# Patient Record
Sex: Male | Born: 1965 | Race: White | Hispanic: No | Marital: Married | Smoking: Former smoker
Health system: Southern US, Community
[De-identification: ages and names within clinical notes are randomized; demographics above are authoritative.]

## PROBLEM LIST (undated history)

## (undated) DIAGNOSIS — J302 Other seasonal allergic rhinitis: Secondary | ICD-10-CM

## (undated) DIAGNOSIS — J939 Pneumothorax, unspecified: Secondary | ICD-10-CM

## (undated) DIAGNOSIS — R748 Abnormal levels of other serum enzymes: Secondary | ICD-10-CM

## (undated) DIAGNOSIS — R12 Heartburn: Secondary | ICD-10-CM

## (undated) DIAGNOSIS — T8859XA Other complications of anesthesia, initial encounter: Secondary | ICD-10-CM

## (undated) DIAGNOSIS — E785 Hyperlipidemia, unspecified: Secondary | ICD-10-CM

## (undated) HISTORY — DX: Hyperlipidemia, unspecified: E78.5

## (undated) HISTORY — PX: KNEE ARTHROSCOPY: SUR90

## (undated) HISTORY — DX: Heartburn: R12

## (undated) HISTORY — DX: Abnormal levels of other serum enzymes: R74.8

## (undated) HISTORY — DX: Other seasonal allergic rhinitis: J30.2

---

## 1993-01-01 HISTORY — PX: CHEST TUBE INSERTION: SHX231

## 2016-06-13 ENCOUNTER — Encounter: Payer: Self-pay | Admitting: Gastroenterology

## 2016-06-14 ENCOUNTER — Encounter: Payer: Self-pay | Admitting: Gastroenterology

## 2016-06-15 ENCOUNTER — Other Ambulatory Visit: Payer: BLUE CROSS/BLUE SHIELD

## 2016-06-15 ENCOUNTER — Ambulatory Visit (AMBULATORY_SURGERY_CENTER): Payer: Self-pay | Admitting: *Deleted

## 2016-06-15 ENCOUNTER — Other Ambulatory Visit: Payer: Self-pay

## 2016-06-15 ENCOUNTER — Encounter: Payer: Self-pay | Admitting: Gastroenterology

## 2016-06-15 VITALS — Ht 74.0 in | Wt 216.0 lb

## 2016-06-15 DIAGNOSIS — Z1211 Encounter for screening for malignant neoplasm of colon: Secondary | ICD-10-CM

## 2016-06-15 DIAGNOSIS — R197 Diarrhea, unspecified: Secondary | ICD-10-CM

## 2016-06-15 MED ORDER — NA SULFATE-K SULFATE-MG SULF 17.5-3.13-1.6 GM/177ML PO SOLN: 1.0000 | Freq: Once | ORAL | 0 refills | Status: AC

## 2016-06-15 NOTE — Progress Notes (Signed)
Denies allergies to eggs or soy products. Denies complications with sedation or anesthesia. Denies O2 use. Denies use of diet or weight loss medications.  Emmi instructions given for colonoscopy.  

## 2016-06-15 NOTE — Progress Notes (Signed)
Pt states he had c diff approximately 8 months ago, felt it had cleared but never retested. Now with c/o cramping and diarrhea, per Dr. Havery Moros have pt retested prior to colonoscopy.

## 2016-06-21 ENCOUNTER — Telehealth: Payer: Self-pay

## 2016-06-21 NOTE — Telephone Encounter (Signed)
Okay thanks for the follow up 

## 2016-06-21 NOTE — Telephone Encounter (Signed)
I have called and left messages for patient on his cell # and also on his wife's cell #, that we need to have him bring by his stool sample today or tomorrow at the very latest. Explained that we need to have these results to see if it is safe to proceed with his colonoscopy that is scheduled for 06/29/16. Asked that they call me back to let me know what their plan is.

## 2016-06-22 ENCOUNTER — Other Ambulatory Visit: Payer: BLUE CROSS/BLUE SHIELD

## 2016-06-22 DIAGNOSIS — R197 Diarrhea, unspecified: Secondary | ICD-10-CM

## 2016-06-23 LAB — CLOSTRIDIUM DIFFICILE BY PCR: Toxigenic C. Difficile by PCR: NOT DETECTED

## 2016-06-29 ENCOUNTER — Ambulatory Visit (AMBULATORY_SURGERY_CENTER): Payer: BLUE CROSS/BLUE SHIELD | Admitting: Gastroenterology

## 2016-06-29 ENCOUNTER — Encounter: Payer: Self-pay | Admitting: Gastroenterology

## 2016-06-29 VITALS — BP 139/96 | HR 61 | Temp 97.5°F | Resp 10 | Ht 74.0 in | Wt 216.0 lb

## 2016-06-29 DIAGNOSIS — D124 Benign neoplasm of descending colon: Secondary | ICD-10-CM

## 2016-06-29 DIAGNOSIS — Z1211 Encounter for screening for malignant neoplasm of colon: Secondary | ICD-10-CM

## 2016-06-29 DIAGNOSIS — D122 Benign neoplasm of ascending colon: Secondary | ICD-10-CM

## 2016-06-29 DIAGNOSIS — Z1212 Encounter for screening for malignant neoplasm of rectum: Secondary | ICD-10-CM | POA: Diagnosis not present

## 2016-06-29 DIAGNOSIS — D123 Benign neoplasm of transverse colon: Secondary | ICD-10-CM | POA: Diagnosis not present

## 2016-06-29 DIAGNOSIS — D120 Benign neoplasm of cecum: Secondary | ICD-10-CM

## 2016-06-29 HISTORY — PX: COLONOSCOPY: SHX174

## 2016-06-29 MED ORDER — SODIUM CHLORIDE 0.9 % IV SOLN
500.0000 mL | INTRAVENOUS | Status: DC
Start: 2016-06-29 — End: 2019-08-31

## 2016-06-29 NOTE — Patient Instructions (Signed)
Handout given on polyps  YOU HAD AN ENDOSCOPIC PROCEDURE TODAY: Refer to the procedure report and other information in the discharge instructions given to you for any specific questions about what was found during the examination. If this information does not answer your questions, please call [place] office at [phone] to clarify.   YOU SHOULD EXPECT: Some feelings of bloating in the abdomen. Passage of more gas than usual. Walking can help get rid of the air that was put into your GI tract during the procedure and reduce the bloating. If you had a lower endoscopy (such as a colonoscopy or flexible sigmoidoscopy) you may notice spotting of blood in your stool or on the toilet paper. Some abdominal soreness may be present for a day or two, also.  DIET: Your first meal following the procedure should be a light meal and then it is ok to progress to your normal diet. A half-sandwich or bowl of soup is an example of a good first meal. Heavy or fried foods are harder to digest and may make you feel nauseous or bloated. Drink plenty of fluids but you should avoid alcoholic beverages for 24 hours. If you had a esophageal dilation, please see attached instructions for diet.    ACTIVITY: Your care partner should take you home directly after the procedure. You should plan to take it easy, moving slowly for the rest of the day. You can resume normal activity the day after the procedure however YOU SHOULD NOT DRIVE, use power tools, machinery or perform tasks that involve climbing or major physical exertion for 24 hours (because of the sedation medicines used during the test).   SYMPTOMS TO REPORT IMMEDIATELY: A gastroenterologist can be reached at any hour. Please call [phone]  for any of the following symptoms:  Following lower endoscopy (colonoscopy, flexible sigmoidoscopy) Excessive amounts of blood in the stool  Significant tenderness, worsening of abdominal pains  Swelling of the abdomen that is  new, acute  Fever of 100 or higher    FOLLOW UP:  If any biopsies were taken you will be contacted by phone or by letter within the next 1-3 weeks. Call [phone]  if you have not heard about the biopsies in 3 weeks.  Please also call with any specific questions about appointments or follow up tests.  

## 2016-06-29 NOTE — Op Note (Addendum)
[place] Patient Name: Leon Brown Procedure Date: 06/29/2016 7:45 AM MRN: [id number] Endoscopist: Remo Lipps P. Armbruster MD, MD Age: 51 Referring MD:  Date of Birth: 1965/03/13 Gender: Male Account #: [id number] Procedure:                Colonoscopy Indications:              Screening for colorectal malignant neoplasm Medicines:                Monitored Anesthesia Care Procedure:                Pre-Anesthesia Assessment:                           - Prior to the procedure, a History and Physical                            was performed, and patient medications and                            allergies were reviewed. The patient's tolerance of                            previous anesthesia was also reviewed. The risks                            and benefits of the procedure and the sedation                            options and risks were discussed with the patient.                            All questions were answered, and informed consent                            was obtained. Prior Anticoagulants: The patient has                            taken no previous anticoagulant or antiplatelet                            agents. ASA Grade Assessment: II - A patient with                            mild systemic disease. After reviewing the risks                            and benefits, the patient was deemed in                            satisfactory condition to undergo the procedure.                           After obtaining informed consent, the colonoscope  was passed under direct vision. Throughout the                            procedure, the patient's blood pressure, pulse, and                            oxygen saturations were monitored continuously.The                            colonoscopy was performed without difficulty. The                            patient tolerated the procedure well. The quality                            of the bowel  preparation was good. The ileocecal                            valve, appendiceal orifice, and rectum were                            photographed. The Model CF-HQ190L [id number])                            scope was introduced through the anus and advanced                            to the the cecum, identified by appendiceal orifice                            and ileocecal valve. Scope In: 9:20:03 AM Scope Out: 9:45:53 AM Scope Withdrawal Time: 0 hours 23 minutes 51 seconds  Total Procedure Duration: 0 hours 25 minutes 50 seconds  Findings:                 The perianal and digital rectal examinations were                            normal.                           A 4 mm polyp was found in the cecum. The polyp was                            sessile. The polyp was removed with a cold snare.                            Resection and retrieval were complete.                           Two sessile polyps were found in the ascending                            colon. The polyps were 4 to 5 mm in size. These  polyps were removed with a cold snare. Resection                            and retrieval were complete.                           Five sessile polyps were found in the transverse                            colon. The polyps were 3 to 5 mm in size. These                            polyps were removed with a cold snare. Resection                            and retrieval were complete.                           Two sessile polyps were found in the descending                            colon. The polyps were 4 mm in size. These polyps                            were removed with a cold snare. Resection and                            retrieval were complete.                           Internal hemorrhoids were found during                            retroflexion. The hemorrhoids were small.                           The exam was otherwise without  abnormality. Complications:            No immediate complications. Estimated blood loss:                            Minimal. Estimated Blood Loss:     Estimated blood loss was minimal. Impression:               - One 4 mm polyp in the cecum, removed with a cold                            snare. Resected and retrieved.                           - Two 4 to 5 mm polyps in the ascending colon,                            removed with a cold snare. Resected and retrieved.                           -  Five 3 to 5 mm polyps in the transverse colon,                            removed with a cold snare. Resected and retrieved.                           - Two 4 mm polyps in the descending colon, removed                            with a cold snare. Resected and retrieved.                           - Internal hemorrhoids.                           - The examination was otherwise normal. Recommendation:           - Patient has a contact number available for                            emergencies. The signs and symptoms of potential                            delayed complications were discussed with the                            patient. Return to normal activities tomorrow.                            Written discharge instructions were provided to the                            patient.                           - Resume previous diet.                           - Continue present medications.                           - Repeat colonoscopy is recommended for                            surveillance. The colonoscopy date will be                            determined after pathology results from today's                            exam become available for review.                           - Await pathology results.                           -  No ibuprofen, naproxen, or other non-steroidal                            anti-inflammatory drugs for 2 weeks after polyp                             removal. Remo Lipps P. Armbruster MD, MD 06/29/2016 8:20:38 AM This report has been signed electronically.

## 2016-06-29 NOTE — Progress Notes (Signed)
Called to room to assist during endoscopic procedure.  Patient ID and intended procedure confirmed with present staff. Received instructions for my participation in the procedure from the performing physician.  

## 2016-06-29 NOTE — Progress Notes (Signed)
Alert and oriented x3, pleased with MAC, report to RN  

## 2016-07-02 ENCOUNTER — Telehealth: Payer: Self-pay

## 2016-07-02 NOTE — Telephone Encounter (Signed)
  Follow up Call-  Call back number 06/29/2016  Post procedure Call Back phone  # [phone]  Permission to leave phone message Yes  Some recent data might be hidden     Left message

## 2016-07-02 NOTE — Telephone Encounter (Signed)
  Follow up Call-  Call back number 06/29/2016  Post procedure Call Back phone  # [phone]  Permission to leave phone message Yes  Some recent data might be hidden     Patient questions:  Do you have a fever, pain , or abdominal swelling? No. Pain Score  0 *  Have you tolerated food without any problems? Yes.    Have you been able to return to your normal activities? Yes.    Do you have any questions about your discharge instructions: Diet   No. Medications  No. Follow up visit  No.  Do you have questions or concerns about your Care? No.  Actions: * If pain score is 4 or above: No action needed, pain <4.

## 2016-07-06 ENCOUNTER — Encounter: Payer: Self-pay | Admitting: Gastroenterology

## 2018-09-11 ENCOUNTER — Emergency Department (HOSPITAL_COMMUNITY): Payer: BC Managed Care – PPO

## 2018-09-11 ENCOUNTER — Other Ambulatory Visit: Payer: Self-pay

## 2018-09-11 ENCOUNTER — Emergency Department (HOSPITAL_COMMUNITY)
Admission: EM | Admit: 2018-09-11 | Discharge: 2018-09-11 | Disposition: A | Discharge status: Home / Self Care | Payer: BC Managed Care – PPO | Attending: Emergency Medicine | Admitting: Emergency Medicine

## 2018-09-11 ENCOUNTER — Encounter (HOSPITAL_COMMUNITY): Payer: Self-pay | Admitting: Emergency Medicine

## 2018-09-11 DIAGNOSIS — R0789 Other chest pain: Secondary | ICD-10-CM | POA: Insufficient documentation

## 2018-09-11 DIAGNOSIS — Z87891 Personal history of nicotine dependence: Secondary | ICD-10-CM | POA: Diagnosis not present

## 2018-09-11 DIAGNOSIS — I10 Essential (primary) hypertension: Secondary | ICD-10-CM | POA: Insufficient documentation

## 2018-09-11 DIAGNOSIS — Z7982 Long term (current) use of aspirin: Secondary | ICD-10-CM | POA: Insufficient documentation

## 2018-09-11 DIAGNOSIS — R079 Chest pain, unspecified: Secondary | ICD-10-CM | POA: Diagnosis present

## 2018-09-11 LAB — BASIC METABOLIC PANEL
Anion gap: 11 (ref 5–15)
BUN: 12 mg/dL (ref 6–20)
CO2: 24 mmol/L (ref 22–32)
Calcium: 9.2 mg/dL (ref 8.9–10.3)
Chloride: 103 mmol/L (ref 98–111)
Creatinine, Ser: 0.97 mg/dL (ref 0.61–1.24)
GFR calc Af Amer: >60 mL/min (ref >60)
GFR calc non Af Amer: >60 mL/min (ref >60)
Glucose, Bld: 90 mg/dL (ref 70–99)
Potassium: 3.5 mmol/L (ref 3.5–5.1)
Sodium: 138 mmol/L (ref 135–145)

## 2018-09-11 LAB — TROPONIN I (HIGH SENSITIVITY)
Troponin I (High Sensitivity): 5 ng/L (ref <18)
Troponin I (High Sensitivity): 5 ng/L (ref <18)

## 2018-09-11 LAB — CBC
HCT: 46 % (ref 39.0–52.0)
Hemoglobin: 15.6 g/dL (ref 13.0–17.0)
MCH: 32.7 pg (ref 26.0–34.0)
MCHC: 33.9 g/dL (ref 30.0–36.0)
MCV: 96.4 fL (ref 80.0–100.0)
Platelets: 210 10*3/uL (ref 150–400)
RBC: 4.77 MIL/uL (ref 4.22–5.81)
RDW: 13.1 % (ref 11.5–15.5)
WBC: 6.7 10*3/uL (ref 4.0–10.5)
nRBC: 0 % (ref 0.0–0.2)

## 2018-09-11 MED ORDER — SODIUM CHLORIDE 0.9% FLUSH: 3.0000 mL | Freq: Once | INTRAVENOUS | Status: DC

## 2018-09-11 MED ORDER — AMLODIPINE BESYLATE 5 MG PO TABS: 5.0000 mg | ORAL_TABLET | Freq: Every day | ORAL | 0 refills | Status: DC

## 2018-09-11 NOTE — ED Notes (Signed)
Pt has 0 pain now. States pain resolved at 1030. Wants to be evaluated to make sure no cardiac event

## 2018-09-11 NOTE — ED Triage Notes (Signed)
Patient states he thinks he might have had a heart attack this morning at 1000. Patient states he had L sided chest pain, diaphoresis, and lightheadedness. Denies pain at present. No nausea or vomiting. Patient denies cardiac history.

## 2018-09-11 NOTE — Discharge Instructions (Signed)
Your lab tests, chest xray and ekg are reassuring tonight that you did not have a heart attack today.  However, your blood pressure has been elevated and you would benefit from starting a blood pressure medicine which has been prescribed.  Also, you would benefit by seeing a cardiologist to set up a cardiac stress test to help ensure cardiac health.

## 2018-09-11 NOTE — ED Provider Notes (Signed)
[facility] EMERGENCY DEPARTMENT Provider Note   CSN: [id number] Arrival date & time: 09/11/18  1557     History   Chief Complaint Chief Complaint  Patient presents with  . Chest Pain    HPI Leon Brown is a 53 y.o. male.  HPI: A 53 year old patient presents for evaluation of chest pain. Initial onset of pain was more than 6 hours ago. The patient's chest pain is sharp and is not worse with exertion. The patient reports some diaphoresis. The patient's chest pain is middle- or left-sided, is not well-localized, is not described as heaviness/pressure/tightness and does not radiate to the arms/jaw/neck. The patient does not complain of nausea. The patient has no history of stroke, has no history of peripheral artery disease, has not smoked in the past 90 days, denies any history of treated diabetes, has no relevant family history of coronary artery disease (first degree relative at less than age 43), is not hypertensive, has no history of hypercholesterolemia and does not have an elevated BMI (>=30).   The history is provided by the patient.  Chest Pain Pain location:  L chest Pain quality: sharp and stabbing   Radiates to: radiated to right chest. Pain severity:  Severe Onset quality:  Sudden Duration:  30 minutes Timing:  Constant Progression:  Resolved Chronicity:  New Context comment:  Patient was working a Tourist information centre manager. episode occured at 10 am today Relieved by:  Rest (He took an adult aspirin on his way home from work this afternoon) Worsened by:  Nothing Ineffective treatments:  None tried Associated symptoms: diaphoresis and dizziness   Associated symptoms: no abdominal pain, no back pain, no fever, no headache, no lower extremity edema, no nausea, no near-syncope, no numbness, no palpitations, no shortness of breath, no syncope, no vomiting and no weakness   Risk factors: male sex   Risk factors comment:  Distant h/o smoking, quit 20+ years ago  Pt has h/o left  spontaneous pneumothorax when in his 20's requiring a chest tube insertion.  Past Medical History:  Diagnosis Date  . Seasonal allergies     There are no active problems to display for this patient.   Past Surgical History:  Procedure Laterality Date  . CHEST TUBE INSERTION Left 1995        Home Medications    Prior to Admission medications   Medication Sig Start Date End Date Taking? Authorizing Provider  aspirin EC 325 MG tablet Take 325 mg by mouth once as needed for mild pain.   Yes [provider]  amLODipine (NORVASC) 5 MG tablet Take 1 tablet (5 mg total) by mouth daily. 09/11/18   Evalee Jefferson, PA-C    Family History Family History  Problem Relation Age of Onset  . Colon cancer Neg Hx   . Esophageal cancer Neg Hx   . Rectal cancer Neg Hx   . Stomach cancer Neg Hx     Social History Social History   Tobacco Use  . Smoking status: Former Smoker    Quit date: 01/01/1993    Years since quitting: 25.7  . Smokeless tobacco: Never Used  Substance Use Topics  . Alcohol use: Yes    Alcohol/week: 10.0 standard drinks    Types: 10 Standard drinks or equivalent per week    Comment: 2 drinks liquor a night  . Drug use: No     Allergies   Patient has no known allergies.   Review of Systems Review of Systems  Constitutional: Positive for  diaphoresis. Negative for fever.  HENT: Negative for congestion and sore throat.   Eyes: Negative.   Respiratory: Negative for chest tightness and shortness of breath.   Cardiovascular: Positive for chest pain. Negative for palpitations, syncope and near-syncope.  Gastrointestinal: Negative for abdominal pain, nausea and vomiting.  Genitourinary: Negative.   Musculoskeletal: Negative for arthralgias, back pain, joint swelling and neck pain.  Skin: Negative.  Negative for rash and wound.  Neurological: Positive for dizziness. Negative for weakness, light-headedness, numbness and headaches.  Psychiatric/Behavioral:  Negative.      Physical Exam Updated Vital Signs BP (!) 151/102   Pulse 74   Temp 98.2 F (36.8 C) (Oral)   Resp 19   Ht 6\' 1"  (1.854 m)   Wt 95.3 kg   SpO2 98%   BMI 27.71 kg/m   Physical Exam Vitals signs and nursing note reviewed.  Constitutional:      Appearance: He is well-developed.  HENT:     Head: Normocephalic and atraumatic.  Eyes:     Conjunctiva/sclera: Conjunctivae normal.  Neck:     Musculoskeletal: Normal range of motion.  Cardiovascular:     Rate and Rhythm: Normal rate and regular rhythm.     Heart sounds: Normal heart sounds.     Comments: Hypertensive  Pulmonary:     Effort: Pulmonary effort is normal.     Breath sounds: Normal breath sounds. No wheezing.  Abdominal:     General: Bowel sounds are normal.     Palpations: Abdomen is soft.     Tenderness: There is no abdominal tenderness.  Musculoskeletal: Normal range of motion.  Skin:    General: Skin is warm and dry.  Neurological:     Mental Status: He is alert.      ED Treatments / Results  Labs (all labs ordered are listed, but only abnormal results are displayed) Labs Reviewed  BASIC METABOLIC PANEL  CBC  TROPONIN I (HIGH SENSITIVITY)  TROPONIN I (HIGH SENSITIVITY)    EKG EKG Interpretation  Date/Time:  Thursday September 11 2018 16:06:39 EDT Ventricular Rate:  83 PR Interval:  152 QRS Duration: 94 QT Interval:  330 QTC Calculation: 387 R Axis:   65 Text Interpretation:  Normal sinus rhythm Normal ECG Confirmed by Milton Ferguson [phone]) on 09/11/2018 4:55:21 PM   Radiology Dg Chest 2 View  Result Date: 09/11/2018 CLINICAL DATA:  Chest pain. EXAM: CHEST - 2 VIEW COMPARISON:  None. FINDINGS: Cardiomediastinal silhouette is normal. Mediastinal contours appear intact. Ectasia of the aorta. There is no evidence of focal airspace consolidation, pleural effusion or pneumothorax. Osseous structures are without acute abnormality. Soft tissues are grossly normal. IMPRESSION: No  active cardiopulmonary disease. Electronically Signed   By: Fidela Salisbury M.D.   On: 09/11/2018 17:10    Procedures Procedures (including critical care time)  Medications Ordered in ED Medications  sodium chloride flush (NS) 0.9 % injection 3 mL (3 mLs Intravenous Not Given 09/11/18 1621)     Initial Impression / Assessment and Plan / ED Course  I have reviewed the triage vital signs and the nursing notes.  Pertinent labs & imaging results that were available during my care of the patient were reviewed by me and considered in my medical decision making (see chart for details).     HEAR Score: 1  Pt with sudden onset of sharp chest pain lasting 30 minutes 11 am today, then resolved. Atypical, delta troponins stable at 5 with normal ekg.  Heart score 1. He has  remained hypertensive while here, not on medications for this. Started norvasc. Plan f/u with cardiology for further cardiac stress testing.  Return precautions discussed.   Final Clinical Impressions(s) / ED Diagnoses   Final diagnoses:  Atypical chest pain  Essential hypertension    ED Discharge Orders         Ordered    amLODipine (NORVASC) 5 MG tablet  Daily     09/11/18 2013           Evalee Jefferson, Hershal Coria 09/11/18 2016    Milton Ferguson, MD 09/12/18 1103

## 2018-10-04 NOTE — Progress Notes (Signed)
CARDIOLOGY CONSULT NOTE       Patient ID: Leon Brown MRN: [id number] DOB/AGE: [birth year] 53 y.o.  Admit date: (Not on file) Referring Physician: Evalee Jefferson PA-C Primary Physician: [facility] Primary Cardiologist: New Reason for Consultation: Chest Pain   Active Problems:   * No active hospital problems. *   HPI:  53 y.o. history of smoking and HTN referred by Evalee Jefferson [facility] for atypical chest pain. Reviewed note from  09/11/18 Sharp non exertional SSCP left sided. Over 6 hours. 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 Having 2nd grand child in 2 weeks  ROS All other systems reviewed and negative except as noted above  Past Medical History:  Diagnosis Date  . Seasonal allergies     Family History  Problem Relation Age of Onset  . Colon cancer Neg Hx   . Esophageal cancer Neg Hx   . Rectal cancer Neg Hx   . Stomach cancer Neg Hx     Social History   Socioeconomic History  . Marital status: Married    Spouse name: Not on file  . Number of children: Not on file  . Years of education: Not on file  . Highest education level: Not on file  Occupational History  . Not on file  Social Needs  . Financial resource strain: Not on file  . Food insecurity    Worry: Not on file    Inability: Not on file  . Transportation needs    Medical: Not on file    Non-medical: Not on file  Tobacco Use  . Smoking status: Former Smoker    Quit date: 01/01/1993    Years since quitting: 25.7  . Smokeless tobacco: Never Used  Substance and Sexual Activity  . Alcohol use: Yes    Alcohol/week: 10.0 standard drinks    Types: 10 Standard drinks or equivalent per week    Comment: 2 drinks liquor a night  . Drug use: No   . Sexual activity: Not on file  Lifestyle  . Physical activity    Days per week: Not on file    Minutes per session: Not on file  . Stress: Not on file  Relationships  . Social Herbalist on phone: Not on file    Gets together: Not on file    Attends religious service: Not on file    Active member of club or organization: Not on file    Attends meetings of clubs or organizations: Not on file    Relationship status: Not on file  . Intimate partner violence    Fear of current or ex partner: Not on file    Emotionally abused: Not on file    Physically abused: Not on file    Forced sexual activity: Not on file  Other Topics Concern  . Not on file  Social History Narrative  . Not on file    Past Surgical History:  Procedure Laterality Date  . CHEST TUBE INSERTION Left 1995      . sodium chloride      Physical Exam: Blood pressure (!) 153/97, pulse (!) 109, temperature (!) 97.1 F (36.2 C), temperature source Temporal, height  6\' 1"  (1.854 m), weight 212 lb (96.2 kg), SpO2 97 %.   Affect appropriate Healthy:  appears stated age 53: normal Neck supple with no adenopathy JVP normal no bruits no thyromegaly Lungs clear with no wheezing and good diaphragmatic motion Heart:  S1/S2 no murmur, no rub, gallop or click PMI normal Abdomen: benighn, BS positve, no tenderness, no AAA no bruit.  No HSM or HJR Distal pulses intact with no bruits No edema Neuro non-focal Skin warm and dry No muscular weakness   Labs:   Lab Results  Component Value Date   WBC 6.7 09/11/2018   HGB 15.6 09/11/2018   HCT 46.0 09/11/2018   MCV 96.4 09/11/2018   PLT 210 09/11/2018      Radiology: Dg Chest 2 View  Result Date: 09/11/2018 CLINICAL DATA:  Chest pain. EXAM: CHEST - 2 VIEW COMPARISON:  None. FINDINGS: Cardiomediastinal silhouette is normal. Mediastinal contours appear intact. Ectasia of the aorta. There is no evidence of focal airspace consolidation, pleural effusion  or pneumothorax. Osseous structures are without acute abnormality. Soft tissues are grossly normal. IMPRESSION: No active cardiopulmonary disease. Electronically Signed   By: Fidela Salisbury M.D.   On: 09/11/2018 17:10    EKG: 0/10/20 NSR rate 83 normal    ASSESSMENT AND PLAN:   1. Chest Pain:  Atypical r/o in ER normal ECG f/u ETT 2. Previous smoker:  CXR in ER NAD 3. ETOH:  counseled on moderation  4. HTN:  Continue amlodipine assess BP response to exercise see #1  Signed: Jenkins Rouge 10/07/2018, 10:28 AM

## 2018-10-07 ENCOUNTER — Ambulatory Visit: Payer: BC Managed Care – PPO | Admitting: Cardiovascular Disease

## 2018-10-07 ENCOUNTER — Other Ambulatory Visit: Payer: Self-pay

## 2018-10-07 ENCOUNTER — Encounter: Payer: Self-pay | Admitting: Cardiovascular Disease

## 2018-10-07 VITALS — BP 153/97 | HR 109 | Temp 97.1°F | Ht 73.0 in | Wt 212.0 lb

## 2018-10-07 DIAGNOSIS — R079 Chest pain, unspecified: Secondary | ICD-10-CM

## 2018-10-07 NOTE — Patient Instructions (Signed)
Medication Instructions:  Your physician recommends that you continue on your current medications as directed. Please refer to the Current Medication list given to you today.   Labwork: NONE  Testing/Procedures: Your physician has requested that you have an exercise tolerance test. For further information please visit [email or web address]. Please also follow instruction sheet, as given.    Follow-Up: Your physician recommends that you schedule a follow-up appointment in: AS NEEDED    Any Other Special Instructions Will Be Listed Below (If Applicable).     If you need a refill on your cardiac medications before your next appointment, please call your pharmacy.

## 2018-10-10 ENCOUNTER — Other Ambulatory Visit (HOSPITAL_COMMUNITY)
Admission: RE | Admit: 2018-10-10 | Discharge: 2018-10-10 | Disposition: A | Discharge status: Home / Self Care | Payer: BC Managed Care – PPO | Source: Ambulatory Visit | Attending: Cardiovascular Disease | Admitting: Cardiovascular Disease

## 2018-10-10 ENCOUNTER — Other Ambulatory Visit: Payer: Self-pay

## 2018-10-14 ENCOUNTER — Ambulatory Visit (HOSPITAL_COMMUNITY)
Admission: RE | Admit: 2018-10-14 | Discharge: 2018-10-14 | Disposition: A | Discharge status: Home / Self Care | Payer: BC Managed Care – PPO | Source: Ambulatory Visit | Attending: Cardiovascular Disease | Admitting: Cardiovascular Disease

## 2018-10-14 ENCOUNTER — Other Ambulatory Visit: Payer: Self-pay

## 2018-10-14 DIAGNOSIS — R079 Chest pain, unspecified: Secondary | ICD-10-CM | POA: Insufficient documentation

## 2018-10-14 LAB — EXERCISE TOLERANCE TEST
Estimated workload: 10.1 METS
Exercise duration (min): 8 min
Exercise duration (sec): 20 s
MPHR: 167 {beats}/min
Peak HR: 151 {beats}/min
Percent HR: 90 %
RPE: 13
Rest HR: 88 {beats}/min

## 2018-10-16 ENCOUNTER — Telehealth: Payer: Self-pay

## 2018-10-16 NOTE — Telephone Encounter (Signed)
-----   Message from Josue Hector, MD sent at 10/14/2018 12:08 PM EDT ----- Normal ETT

## 2018-10-16 NOTE — Telephone Encounter (Signed)
Pt.notified

## 2018-11-02 DIAGNOSIS — U071 COVID-19: Secondary | ICD-10-CM

## 2018-11-02 HISTORY — DX: COVID-19: U07.1

## 2018-12-19 ENCOUNTER — Other Ambulatory Visit: Payer: Self-pay

## 2018-12-19 ENCOUNTER — Encounter (HOSPITAL_COMMUNITY): Payer: Self-pay | Admitting: Emergency Medicine

## 2018-12-19 ENCOUNTER — Emergency Department (HOSPITAL_COMMUNITY)
Admission: EM | Admit: 2018-12-19 | Discharge: 2018-12-19 | Disposition: A | Discharge status: Home / Self Care | Payer: BC Managed Care – PPO | Attending: Emergency Medicine | Admitting: Emergency Medicine

## 2018-12-19 ENCOUNTER — Emergency Department (HOSPITAL_COMMUNITY): Payer: BC Managed Care – PPO

## 2018-12-19 DIAGNOSIS — Z20828 Contact with and (suspected) exposure to other viral communicable diseases: Secondary | ICD-10-CM | POA: Diagnosis not present

## 2018-12-19 DIAGNOSIS — Z87891 Personal history of nicotine dependence: Secondary | ICD-10-CM | POA: Diagnosis not present

## 2018-12-19 DIAGNOSIS — R0602 Shortness of breath: Secondary | ICD-10-CM | POA: Diagnosis present

## 2018-12-19 DIAGNOSIS — Z7982 Long term (current) use of aspirin: Secondary | ICD-10-CM | POA: Diagnosis not present

## 2018-12-19 DIAGNOSIS — Z20822 Contact with and (suspected) exposure to covid-19: Secondary | ICD-10-CM

## 2018-12-19 DIAGNOSIS — Z79899 Other long term (current) drug therapy: Secondary | ICD-10-CM | POA: Insufficient documentation

## 2018-12-19 HISTORY — DX: Pneumothorax, unspecified: J93.9

## 2018-12-19 MED ORDER — BENZONATATE 100 MG PO CAPS: 100.0000 mg | ORAL_CAPSULE | Freq: Three times a day (TID) | ORAL | 0 refills | Status: DC | PRN

## 2018-12-19 MED ORDER — ALBUTEROL SULFATE HFA 108 (90 BASE) MCG/ACT IN AERS
1.0000 | INHALATION_SPRAY | Freq: Once | RESPIRATORY_TRACT | Status: AC
  Administered 2018-12-19: 1 via RESPIRATORY_TRACT
  Filled 2018-12-19: qty 6.7

## 2018-12-19 NOTE — ED Notes (Signed)
Saturday after Thanksgiving, pt wife, child and other family members tested positive for covid;   In the ensuing 3 weeks since pt has experienced dyspnea   Has not been tested  Here stating SOB worse in the last several days   Has not sought care with PCP but states urgent care sent him here

## 2018-12-19 NOTE — Discharge Instructions (Signed)
Start using albuterol inhaler 1 to 2 puffs every 4-6 hours as needed for shortness of breath.  If you find that you are using the inhaler more than prescribed please return to the emergency department for evaluation.  You can take 1 to 2 tablets of Tylenol every 6 hours as needed for pain or fever.  Drink plenty of fluids and get plenty of rest.  Take Tessalon as needed for cough.  You can also use over-the-counter medications such as NyQuil, DayQuil, and allergy medicines which can help clear up cough and nasal congestion.  Follow-up with your primary care provider for reevaluation of your symptoms.  You may also find helpful to buy a pulse oximeter which is a device that checks your oxygen levels.  Come to the emergency department immediately if your oxygen levels drop below 90% at any point or if you find that you are very short of breath.  Return to the emergency department sooner if any concerning signs or symptoms develop such as persistent vomiting, uncontrolled fevers, loss of consciousness, severe chest pain or shortness of breath.

## 2018-12-19 NOTE — ED Provider Notes (Signed)
[facility] EMERGENCY DEPARTMENT Provider Note   CSN: [id number] Arrival date & time: 12/19/18  1446     History Chief Complaint  Patient presents with  . Shortness of Breath    Leon Brown is a 53 y.o. male with history of pneumothorax and seasonal allergies presents for evaluation of gradual onset, progressively worsening shortness of breath for 2 or 3 weeks.  He reports that he thinks he had a Covid over Thanksgiving as he and his family traveled to [place] and were around many people.  He reports his wife, child, and other family members all tested positive for Covid but he was not tested.  He had loss of taste and smell, cough, fatigue.  The majority of the symptoms lasted for about 7 days before resolving but he states "towards the tail end I began to develop shortness of breath".  He reports that this has been more persistent and is now fairly constant, worsens with any exertion.  He denies chest pain, fevers, nausea, vomiting, abdominal pain.  He has had some decreased appetite but is able to tolerate p.o. food and fluids without difficulty.  He has not tried anything for his symptoms.  He is a former smoker but quit when he had a pneumothorax in his 41s.  Reports his symptoms do not feel like when he had a pneumothorax.  He denies leg swelling, hemoptysis, prior history of DVT or PE, or hormone replacement therapy.  When he traveled to [facility] they drove the distance of around 4 hours and he does report taking breaks during that time.  The history is provided by the patient.       Past Medical History:  Diagnosis Date  . Pneumothorax   . Seasonal allergies     There are no problems to display for this patient.   Past Surgical History:  Procedure Laterality Date  . CHEST TUBE INSERTION Left 1995       Family History  Problem Relation Age of Onset  . Colon cancer Neg Hx   . Esophageal cancer Neg Hx   . Rectal cancer Neg Hx   . Stomach cancer  Neg Hx     Social History   Tobacco Use  . Smoking status: Former Smoker    Quit date: 01/01/1993    Years since quitting: 25.9  . Smokeless tobacco: Never Used  Substance Use Topics  . Alcohol use: Yes    Alcohol/week: 10.0 standard drinks    Types: 10 Standard drinks or equivalent per week    Comment: 2 drinks liquor a night  . Drug use: No    Home Medications Prior to Admission medications   Medication Sig Start Date End Date Taking? Authorizing Provider  amLODipine (NORVASC) 5 MG tablet Take 1 tablet (5 mg total) by mouth daily. Patient not taking: Reported on 10/07/2018 09/11/18   Evalee Jefferson, PA-C  aspirin EC 325 MG tablet Take 325 mg by mouth once as needed for mild pain.    [provider]  benzonatate (TESSALON) 100 MG capsule Take 1 capsule (100 mg total) by mouth 3 (three) times daily as needed for cough. 12/19/18   Rodell Perna A, PA-C    Allergies    Patient has no known allergies.  Review of Systems   Review of Systems  Constitutional: Negative for chills and fever.  Respiratory: Positive for cough and shortness of breath.   Cardiovascular: Negative for chest pain and leg swelling.  Gastrointestinal: Negative for  abdominal pain, nausea and vomiting.  All other systems reviewed and are negative.   Physical Exam Updated Vital Signs BP 125/89 (BP Location: Left Arm)   Pulse 79   Temp 98.7 F (37.1 C) (Oral)   Resp 17   Ht 6\' 1"  (1.854 m)   Wt 90.7 kg   SpO2 99%   BMI 26.39 kg/m   Physical Exam Vitals and nursing note reviewed.  Constitutional:      General: He is not in acute distress.    Appearance: He is well-developed.     Comments: Resting comfortably in bed  HENT:     Head: Normocephalic and atraumatic.  Eyes:     General:        Right eye: No discharge.        Left eye: No discharge.     Conjunctiva/sclera: Conjunctivae normal.  Neck:     Vascular: No JVD.     Trachea: No tracheal deviation.  Cardiovascular:     Rate and  Rhythm: Normal rate and regular rhythm.     Comments: 2+ radial and DP/PT pulses bilaterally, Homans sign absent bilaterally, no lower extremity edema, no palpable cords, compartments are soft  Pulmonary:     Effort: Pulmonary effort is normal.     Breath sounds: Normal breath sounds.     Comments: Equal rise and fall of chest, no increased work of breathing.  Speaking in full sentences without difficulty, SPO2 saturations 99 to 100% on room air and remained in this range with ambulation on pulse oximetry. Abdominal:     General: Bowel sounds are normal. There is no distension.     Palpations: Abdomen is soft.     Tenderness: There is no abdominal tenderness.  Musculoskeletal:     Cervical back: Neck supple.     Right lower leg: No tenderness. No edema.     Left lower leg: No tenderness. No edema.  Skin:    General: Skin is warm and dry.     Findings: No erythema.  Neurological:     Mental Status: He is alert.  Psychiatric:        Behavior: Behavior normal.     ED Results / Procedures / Treatments   Labs (all labs ordered are listed, but only abnormal results are displayed) Labs Reviewed  NOVEL CORONAVIRUS, NAA (HOSP ORDER, SEND-OUT TO REF LAB; TAT 18-24 HRS)    EKG EKG Interpretation  Date/Time:  Friday December 19 2018 15:15:36 EST Ventricular Rate:  85 PR Interval:  156 QRS Duration: 92 QT Interval:  340 QTC Calculation: 404 R Axis:   79 Text Interpretation: Normal sinus rhythm Normal ECG Confirmed by Lennice Sites ([id number]) on 12/20/2018 9:09:50 AM   Radiology DG Chest Portable 1 View  Result Date: 12/19/2018 CLINICAL DATA:  Shortness of breath.  History of COVID is suspected. EXAM: PORTABLE CHEST 1 VIEW COMPARISON:  09/11/2018 FINDINGS: Cardiomediastinal contours are normal.  Lungs are clear. No signs of pleural effusion. No acute bone finding. IMPRESSION: No acute cardiopulmonary disease. Electronically Signed   By: Zetta Bills M.D.   On: 12/19/2018 20:53     Procedures Procedures (including critical care time)  Medications Ordered in ED Medications  albuterol (VENTOLIN HFA) 108 (90 Base) MCG/ACT inhaler 1 puff (1 puff Inhalation Given 12/19/18 2116)    ED Course  I have reviewed the triage vital signs and the nursing notes.  Pertinent labs & imaging results that were available during my care of the patient were reviewed  by me and considered in my medical decision making (see chart for details).    MDM Rules/Calculators/A&P                      Leon Brown was evaluated in Emergency Department on 12/20/2018 for the symptoms described in the history of present illness. He was evaluated in the context of the global COVID-19 pandemic, which necessitated consideration that the patient might be at risk for infection with the SARS-CoV-2 virus that causes COVID-19. Institutional protocols and algorithms that pertain to the evaluation of patients at risk for COVID-19 are in a state of rapid change based on information released by regulatory bodies including the CDC and federal and state organizations. These policies and algorithms were followed during the patient's care in the ED.  Patient presenting for evaluation of progressively worsening shortness of breath.  Recently had COVID-19 symptoms and noted multiple family members tested positive but he himself did not get tested at all.  He likely had COVID-19 and is experiencing some residual shortness of breath as a result of the infection.  On my assessment he is afebrile and vital signs are stable.  He is nontoxic in appearance.  No evidence of respiratory distress and he is speaking in full sentences without difficulty.  I ambulated him in the room on pulse oximetry with stable SPO2 saturations between 99 to 100% on room air and he did not exhibit significantly increased work of breathing with ambulation.  Chest x-ray was obtained which shows no acute cardiopulmonary abnormalities, no evidence of  edema, consolidation, or pneumothorax.  Considered PE however in the absence of tachycardia, hypoxia, or tachypnea I have a low suspicion of this.  He did have recent travel however no prolonged immobilization with his travel and he is overall low risk for PE.  EKG shows normal sinus rhythm, no acute ischemic abnormalities.  Doubt ACS/MI, cardiac tamponade, esophageal rupture, or pneumonia.  Discussed quarantining at home per current CDC guidelines, symptomatic management, and purchasing a pulse oximeter to monitor oxygen levels at home.  Recommend follow-up with PCP for reevaluation of symptoms.  Discussed strict ED return precautions. Patient verbalized understanding of and agreement with plan and is safe for discharge home at this time.  Final Clinical Impression(s) / ED Diagnoses Final diagnoses:  Shortness of breath  Suspected COVID-19 virus infection    Rx / DC Orders ED Discharge Orders         Ordered    benzonatate (TESSALON) 100 MG capsule  3 times daily PRN     12/19/18 2101           Renita Papa, PA-C 12/20/18 1802    Maudie Flakes, MD 12/23/18 1555

## 2018-12-19 NOTE — ED Notes (Signed)
No EKG needed per [facility]

## 2018-12-19 NOTE — ED Triage Notes (Signed)
Patient reports increasing SOB for the past 3 weeks. Patient states he had Covid, was not tested but all family members he lives with were posiitve. Patient reports over the past 3 days his breathing has become worse. 98% on RA in Triage, RR 18.

## 2018-12-21 LAB — NOVEL CORONAVIRUS, NAA (HOSP ORDER, SEND-OUT TO REF LAB; TAT 18-24 HRS): SARS-CoV-2, NAA: NOT DETECTED

## 2019-07-10 ENCOUNTER — Encounter: Payer: Self-pay | Admitting: Gastroenterology

## 2019-08-31 ENCOUNTER — Ambulatory Visit (AMBULATORY_SURGERY_CENTER): Payer: Self-pay | Admitting: *Deleted

## 2019-08-31 ENCOUNTER — Encounter: Payer: Self-pay | Admitting: Gastroenterology

## 2019-08-31 ENCOUNTER — Other Ambulatory Visit: Payer: Self-pay

## 2019-08-31 VITALS — Ht 73.0 in | Wt 192.0 lb

## 2019-08-31 DIAGNOSIS — Z01818 Encounter for other preprocedural examination: Secondary | ICD-10-CM

## 2019-08-31 DIAGNOSIS — Z86010 Personal history of colonic polyps: Secondary | ICD-10-CM

## 2019-08-31 MED ORDER — NA SULFATE-K SULFATE-MG SULF 17.5-3.13-1.6 GM/177ML PO SOLN: ORAL | 0 refills | Status: DC

## 2019-08-31 NOTE — Progress Notes (Signed)
Patient is here in-person for PV. Patient denies any allergies to eggs or soy. Patient denies any problems with anesthesia/sedation. Patient denies any oxygen use at home. Patient denies taking any diet/weight loss medications or blood thinners. Patient is not being treated for MRSA or C-diff. Patient is aware of our care-partner policy and ZPSUG-64 safety protocol.  COVID-19 screening test is on 9/8, the pt is aware.   Prep Prescription coupon given to the patient. Patient is having heartburn, nausea  And weight loss, made office visit 1sty available to see APP. Patient wants to do the colonoscopy as scheduled.

## 2019-09-09 ENCOUNTER — Ambulatory Visit (INDEPENDENT_AMBULATORY_CARE_PROVIDER_SITE_OTHER): Payer: BC Managed Care – PPO

## 2019-09-09 ENCOUNTER — Other Ambulatory Visit: Payer: Self-pay | Admitting: Gastroenterology

## 2019-09-09 DIAGNOSIS — Z1159 Encounter for screening for other viral diseases: Secondary | ICD-10-CM

## 2019-09-09 LAB — SARS CORONAVIRUS 2 (TAT 6-24 HRS): SARS Coronavirus 2: NEGATIVE

## 2019-09-14 ENCOUNTER — Ambulatory Visit (AMBULATORY_SURGERY_CENTER): Payer: BC Managed Care – PPO | Admitting: Gastroenterology

## 2019-09-14 ENCOUNTER — Other Ambulatory Visit: Payer: Self-pay

## 2019-09-14 ENCOUNTER — Encounter: Payer: Self-pay | Admitting: Gastroenterology

## 2019-09-14 VITALS — BP 124/69 | HR 67 | Temp 96.8°F | Resp 15 | Ht 73.0 in | Wt 192.0 lb

## 2019-09-14 DIAGNOSIS — D124 Benign neoplasm of descending colon: Secondary | ICD-10-CM

## 2019-09-14 DIAGNOSIS — D123 Benign neoplasm of transverse colon: Secondary | ICD-10-CM

## 2019-09-14 DIAGNOSIS — Z86010 Personal history of colonic polyps: Secondary | ICD-10-CM | POA: Diagnosis not present

## 2019-09-14 MED ORDER — SODIUM CHLORIDE 0.9 % IV SOLN: 500.0000 mL | Freq: Once | INTRAVENOUS | Status: DC

## 2019-09-14 NOTE — Progress Notes (Signed)
Pt's states no medical or surgical changes since previsit or office visit. 

## 2019-09-14 NOTE — Progress Notes (Signed)
Called to room to assist during endoscopic procedure.  Patient ID and intended procedure confirmed with present staff. Received instructions for my participation in the procedure from the performing physician.  

## 2019-09-14 NOTE — Patient Instructions (Signed)
Please read handouts provided. Continue present medications. Await pathology results.   YOU HAD AN ENDOSCOPIC PROCEDURE TODAY AT THE [facility]:   Refer to the procedure report that was given to you for any specific questions about what was found during the examination.  If the procedure report does not answer your questions, please call your gastroenterologist to clarify.  If you requested that your care partner not be given the details of your procedure findings, then the procedure report has been included in a sealed envelope for you to review at your convenience later.  YOU SHOULD EXPECT: Some feelings of bloating in the abdomen. Passage of more gas than usual.  Walking can help get rid of the air that was put into your GI tract during the procedure and reduce the bloating. If you had a lower endoscopy (such as a colonoscopy or flexible sigmoidoscopy) you may notice spotting of blood in your stool or on the toilet paper. If you underwent a bowel prep for your procedure, you may not have a normal bowel movement for a few days.  Please Note:  You might notice some irritation and congestion in your nose or some drainage.  This is from the oxygen used during your procedure.  There is no need for concern and it should clear up in a day or so.  SYMPTOMS TO REPORT IMMEDIATELY:  Following lower endoscopy (colonoscopy or flexible sigmoidoscopy):  Excessive amounts of blood in the stool  Significant tenderness or worsening of abdominal pains  Swelling of the abdomen that is new, acute  Fever of 100F or higher   For urgent or emergent issues, a gastroenterologist can be reached at any hour by calling [phone]. Do not use MyChart messaging for urgent concerns.    DIET:  We do recommend a small meal at first, but then you may proceed to your regular diet.  Drink plenty of fluids but you should avoid alcoholic beverages for 24 hours.  ACTIVITY:  You should plan to take it easy  for the rest of today and you should NOT DRIVE or use heavy machinery until tomorrow (because of the sedation medicines used during the test).    FOLLOW UP: Our staff will call the number listed on your records 48-72 hours following your procedure to check on you and address any questions or concerns that you may have regarding the information given to you following your procedure. If we do not reach you, we will leave a message.  We will attempt to reach you two times.  During this call, we will ask if you have developed any symptoms of COVID 19. If you develop any symptoms (ie: fever, flu-like symptoms, shortness of breath, cough etc.) before then, please call [phone].  If you test positive for Covid 19 in the 2 weeks post procedure, please call and report this information to us.    If any biopsies were taken you will be contacted by phone or by letter within the next 1-3 weeks.  Please call us at [phone] if you have not heard about the biopsies in 3 weeks.    SIGNATURES/CONFIDENTIALITY: You and/or your care partner have signed paperwork which will be entered into your electronic medical record.  These signatures attest to the fact that that the information above on your After Visit Summary has been reviewed and is understood.  Full responsibility of the confidentiality of this discharge information lies with you and/or your care-partner.  

## 2019-09-14 NOTE — Progress Notes (Signed)
Report to PACU, RN, vss, BBS= Clear.  

## 2019-09-14 NOTE — Op Note (Signed)
[facility] Patient Name: Leon Brown Procedure Date: 09/14/2019 8:15 AM MRN: [id number] Endoscopist: Remo Lipps P. Havery Moros , MD Age: 54 Referring MD:  Date of Birth: Jun 11, 1965 Gender: Male Account #: [id number] Procedure:                Colonoscopy Indications:              High risk colon cancer surveillance: Personal                            history of colonic polyps (colonoscopy 06/2016 - 10                            polyps removed, most adenomas) Medicines:                Monitored Anesthesia Care Procedure:                Pre-Anesthesia Assessment:                           - Prior to the procedure, a History and Physical                            was performed, and patient medications and                            allergies were reviewed. The patient's tolerance of                            previous anesthesia was also reviewed. The risks                            and benefits of the procedure and the sedation                            options and risks were discussed with the patient.                            All questions were answered, and informed consent                            was obtained. Prior Anticoagulants: The patient has                            taken no previous anticoagulant or antiplatelet                            agents. ASA Grade Assessment: II - A patient with                            mild systemic disease. After reviewing the risks                            and benefits, the patient was deemed in  satisfactory condition to undergo the procedure.                           After obtaining informed consent, the colonoscope                            was passed under direct vision. Throughout the                            procedure, the patient's blood pressure, pulse, and                            oxygen saturations were monitored continuously. The                            Colonoscope was introduced  through the anus and                            advanced to the the cecum, identified by                            appendiceal orifice and ileocecal valve. The                            colonoscopy was performed without difficulty. The                            patient tolerated the procedure well. The quality                            of the bowel preparation was good. The ileocecal                            valve, appendiceal orifice, and rectum were                            photographed. Scope In: 8:28:31 AM Scope Out: 8:44:04 AM Scope Withdrawal Time: 0 hours 10 minutes 50 seconds  Total Procedure Duration: 0 hours 15 minutes 33 seconds  Findings:                 The perianal and digital rectal examinations were                            normal.                           A 5 mm polyp was found in the transverse colon. The                            polyp was sessile. The polyp was removed with a                            cold snare. Resection and retrieval were complete.  A 3 to 4 mm polyp was found in the descending                            colon. The polyp was sessile. The polyp was removed                            with a cold snare. Resection and retrieval were                            complete.                           Internal hemorrhoids were found during retroflexion.                           The exam was otherwise without abnormality. Complications:            No immediate complications. Estimated blood loss:                            Minimal. Estimated Blood Loss:     Estimated blood loss was minimal. Impression:               - One 5 mm polyp in the transverse colon, removed                            with a cold snare. Resected and retrieved.                           - One 3 to 4 mm polyp in the descending colon,                            removed with a cold snare. Resected and retrieved.                           - Internal  hemorrhoids.                           - The examination was otherwise normal. Recommendation:           - Patient has a contact number available for                            emergencies. The signs and symptoms of potential                            delayed complications were discussed with the                            patient. Return to normal activities tomorrow.                            Written discharge instructions were provided to the  patient.                           - Resume previous diet.                           - Continue present medications.                           - Await pathology results. Anticipate repeat                            colonoscopy in 5 years given numerous adenomas                            removed in 2018 [place]. Ioane Bhola, MD 09/14/2019 8:48:10 AM This report has been signed electronically.

## 2019-09-16 ENCOUNTER — Telehealth: Payer: Self-pay

## 2019-09-16 NOTE — Telephone Encounter (Signed)
  Follow up Call-  Call back number 09/14/2019  Post procedure Call Back phone  # [phone]  Permission to leave phone message Yes  Some recent data might be hidden     Patient questions:  Do you have a fever, pain , or abdominal swelling? No. Pain Score  0 *  Have you tolerated food without any problems? Yes.    Have you been able to return to your normal activities? Yes.    Do you have any questions about your discharge instructions: Diet   No. Medications  No. Follow up visit  No.  Do you have questions or concerns about your Care? No.  Actions: * If pain score is 4 or above: No action needed, pain <4.  1. Have you developed a fever since your procedure? no  2.   Have you had an respiratory symptoms (SOB or cough) since your procedure? no  3.   Have you tested positive for COVID 19 since your procedure no  4.   Have you had any family members/close contacts diagnosed with the COVID 19 since your procedure?  no   If yes to any of these questions please route to Joylene John, RN and Joella Prince, RN

## 2019-09-21 ENCOUNTER — Encounter: Payer: Self-pay | Admitting: Gastroenterology

## 2019-10-05 ENCOUNTER — Encounter: Payer: Self-pay | Admitting: Nurse Practitioner

## 2019-10-05 ENCOUNTER — Ambulatory Visit (INDEPENDENT_AMBULATORY_CARE_PROVIDER_SITE_OTHER): Payer: BC Managed Care – PPO | Admitting: Nurse Practitioner

## 2019-10-05 VITALS — BP 150/72 | HR 88 | Ht 73.0 in | Wt 191.2 lb

## 2019-10-05 DIAGNOSIS — R1310 Dysphagia, unspecified: Secondary | ICD-10-CM | POA: Diagnosis not present

## 2019-10-05 NOTE — Patient Instructions (Signed)
If you are age 54 or older, your body mass index should be between 23-30. Your Body mass index is 25.23 kg/m. If this is out of the aforementioned range listed, please consider follow up with your Primary Care Provider.  If you are age 71 or younger, your body mass index should be between 19-25. Your Body mass index is 25.23 kg/m. If this is out of the aformentioned range listed, please consider follow up with your Primary Care Provider.   You have been scheduled for an endoscopy. Please follow written instructions given to you at your visit today. If you use inhalers (even only as needed), please bring them with you on the day of your procedure.  Due to recent changes in healthcare laws, you may see the results of your imaging and laboratory studies on MyChart before your provider has had a chance to review them.  We understand that in some cases there may be results that are confusing or concerning to you. Not all laboratory results come back in the same time frame and the provider may be waiting for multiple results in order to interpret others.  Please give [place] 48 hours in order for your provider to thoroughly review all the results before contacting the office for clarification of your results.

## 2019-10-05 NOTE — Progress Notes (Signed)
° ° ° °  ASSESSMENT AND PLAN    # Dysphagia with weight loss ( ~ 20 pounds ) --Rule out esophageal stricture, malignancy, dysmotility disorder. The risks and benefits of EGD were discussed and the patient agrees to proceed.  --Advised patient to eat small bites, chew well with liquids in between bites to avoid food impaction. --Continue PPI for now    [place]     Primary Gastroenterologist : [place] Cellar, MD  Chief Complaint : Swallowing problems  Leon Brown is a 54 y.o. male with PMH / [place] significant for,  but not necessarily limited to: Adenomatous colon polyps,  COVID, former tobacco use, HTN  Patient has a history of adenomatous colon polyps.  In fact, he just underwent surveillance colonoscopy last month.   Patient here for evaluation of nausea and dysphagia. Symptoms started last Thanksgiving around the time he had COVID. Symptoms are intermittent.  Sometimes food feels like it holds up in his esophagus. Sometimes he regurgitate liquids back into his mouth during a meal. After a few minutes he can swallow liquids again.  Sometimes gets nauseated during a meal and may vomit if tries to continue eating. Patient says he had GERD symptoms treated with times for a period of about 10 years but no symptoms in the last 20 years. PCP started him on Prilosec for the swallowing problems a few months back. Since then the episodes of dysphagia have been less frequent but still occurr about once a week. His weight is down involuntarily from 212 pounds to 191 pounds since symptoms started about a year ago.     Past Medical History:  Diagnosis Date   COVID-19 11/2018   Elevated liver enzymes    Heartburn    Hyperlipidemia    Pneumothorax    Seasonal allergies     Current Medications, Allergies, Past Surgical History, Family History and Social History were reviewed in [employer] record.   Current Outpatient Medications  Medication  Sig Dispense Refill   aspirin EC 325 MG tablet Take 325 mg by mouth daily.      Multiple Vitamin (MULTIVITAMIN) tablet Take 1 tablet by mouth daily.     omeprazole (PRILOSEC OTC) 20 MG tablet Take 20 mg by mouth daily.     No current facility-administered medications for this visit.    Review of Systems: No chest pain. No shortness of breath. No urinary complaints.   PHYSICAL EXAM :    Wt Readings from Last 3 Encounters:  10/05/19 191 lb 3.2 oz (86.7 kg)  09/14/19 192 lb (87.1 kg)  08/31/19 192 lb (87.1 kg)    BP (!) 150/72    Pulse 88    Ht 6\' 1"  (1.854 m)    Wt 191 lb 3.2 oz (86.7 kg)    SpO2 97%    BMI 25.23 kg/m  Constitutional:  Pleasant male in no acute distress. Psychiatric: Normal mood and affect. Behavior is normal. EENT: Pupils normal.  Conjunctivae are normal. No scleral icterus. Neck supple.  Cardiovascular: Normal rate, regular rhythm. No edema Pulmonary/chest: Effort normal and breath sounds normal. No wheezing, rales or rhonchi. Abdominal: Soft, nondistended, nontender. Bowel sounds active throughout. There are no masses palpable. No hepatomegaly. Neurological: Alert and oriented to person place and time. Skin: Skin is warm and dry. No rashes noted.  Tye Savoy, NP  10/05/2019, 1:49 PM

## 2019-10-05 NOTE — Progress Notes (Signed)
Agree with assessment and plan as outlined.  

## 2019-10-14 ENCOUNTER — Encounter: Payer: Self-pay | Admitting: Gastroenterology

## 2019-10-15 ENCOUNTER — Other Ambulatory Visit: Payer: Self-pay | Admitting: Gastroenterology

## 2019-10-15 LAB — SARS CORONAVIRUS 2 (TAT 6-24 HRS): SARS Coronavirus 2: NEGATIVE

## 2019-10-19 ENCOUNTER — Other Ambulatory Visit: Payer: Self-pay

## 2019-10-19 ENCOUNTER — Encounter: Payer: Self-pay | Admitting: Gastroenterology

## 2019-10-19 ENCOUNTER — Ambulatory Visit (AMBULATORY_SURGERY_CENTER): Payer: BC Managed Care – PPO | Admitting: Gastroenterology

## 2019-10-19 VITALS — BP 146/99 | HR 64 | Temp 98.6°F | Resp 19 | Ht 73.0 in | Wt 191.0 lb

## 2019-10-19 DIAGNOSIS — K222 Esophageal obstruction: Secondary | ICD-10-CM | POA: Diagnosis not present

## 2019-10-19 DIAGNOSIS — K449 Diaphragmatic hernia without obstruction or gangrene: Secondary | ICD-10-CM | POA: Diagnosis not present

## 2019-10-19 DIAGNOSIS — R1310 Dysphagia, unspecified: Secondary | ICD-10-CM

## 2019-10-19 DIAGNOSIS — K2970 Gastritis, unspecified, without bleeding: Secondary | ICD-10-CM | POA: Diagnosis not present

## 2019-10-19 DIAGNOSIS — K209 Esophagitis, unspecified without bleeding: Secondary | ICD-10-CM | POA: Diagnosis not present

## 2019-10-19 MED ORDER — SODIUM CHLORIDE 0.9 % IV SOLN: 500.0000 mL | Freq: Once | INTRAVENOUS | Status: DC

## 2019-10-19 NOTE — Progress Notes (Signed)
PT taken to PACU. Monitors in place. VSS. Report given to RN. 

## 2019-10-19 NOTE — Op Note (Signed)
[facility] Patient Name: Leon Brown Procedure Date: 10/19/2019 2:14 PM MRN: [id number] Endoscopist: Remo Lipps P. Havery Moros , MD Age: 54 Referring MD:  Date of Birth: 03/07/1965 Gender: Male Account #: [id number] Procedure:                Upper GI endoscopy Indications:              Dysphagia Medicines:                Monitored Anesthesia Care Procedure:                Pre-Anesthesia Assessment:                           - Prior to the procedure, a History and Physical                            was performed, and patient medications and                            allergies were reviewed. The patient's tolerance of                            previous anesthesia was also reviewed. The risks                            and benefits of the procedure and the sedation                            options and risks were discussed with the patient.                            All questions were answered, and informed consent                            was obtained. Prior Anticoagulants: The patient has                            taken no previous anticoagulant or antiplatelet                            agents. ASA Grade Assessment: II - A patient with                            mild systemic disease. After reviewing the risks                            and benefits, the patient was deemed in                            satisfactory condition to undergo the procedure.                           After obtaining informed consent, the endoscope was  passed under direct vision. Throughout the                            procedure, the patient's blood pressure, pulse, and                            oxygen saturations were monitored continuously. The                            Endoscope was introduced through the mouth, and                            advanced to the second part of duodenum. The upper                            GI endoscopy was accomplished without  difficulty.                            The patient tolerated the procedure well. Scope In: Scope Out: Findings:                 Esophagogastric landmarks were identified: the                            Z-line was found at 40 cm, the gastroesophageal                            junction was found at 40 cm and the upper extent of                            the gastric folds was found at 42 cm from the                            incisors.                           A 2 cm hiatal hernia was present.                           One benign-appearing, intrinsic moderate stenosis                            was found 40 cm from the incisors. This stenosis                            measured less than one cm (in length). A TTS                            dilator was passed through the scope. Dilation with                            an 11-13-11 mm balloon dilator was performed to 11  mm, 12 mm and 13 mm and an appropriate mucosal                            wrent was noted. Biopsies were taken with a cold                            forceps for histology and to open the stricture                            further.                           The exam of the esophagus was otherwise normal.                           The entire examined stomach was normal.                           The duodenal bulb and second portion of the                            duodenum were normal. Complications:            No immediate complications. Estimated blood loss:                            Minimal. Estimated Blood Loss:     Estimated blood loss was minimal. Impression:               - Esophagogastric landmarks identified.                           - 2 cm hiatal hernia.                           - Benign-appearing esophageal stenosis. Dilated to                            87mm with good result, biopsied.                           - Normal stomach.                           - Normal duodenal bulb  and second portion of the                            duodenum. Recommendation:           - Patient has a contact number available for                            emergencies. The signs and symptoms of potential                            delayed complications were discussed with the  patient. Return to normal activities tomorrow.                            Written discharge instructions were provided to the                            patient.                           - Post dilation diet.                           - Continue present medications.                           - Await pathology results.                           - Given diameter of luminal narrowing patient may                            benefit from a repeat dilation in a few weeks. If                            dysphagia persists following initial dilation                            please contact us to schedule follow up dilation Remo Lipps P. Prince Couey, MD 10/19/2019 2:40:42 PM This report has been signed electronically.

## 2019-10-19 NOTE — Progress Notes (Signed)
Pt's states no medical or surgical changes since previsit or office visit.  CW - vitals 

## 2019-10-19 NOTE — Progress Notes (Signed)
Called to room to assist during endoscopic procedure.  Patient ID and intended procedure confirmed with present staff. Received instructions for my participation in the procedure from the performing physician.  

## 2019-10-19 NOTE — Patient Instructions (Signed)
HANDOUTS PROVIDED ON: HIATAL HERNIA, ESOPHAGITIS & STRICTURE, & POST DILATION DIET  The biopsies taken today have been sent for pathology.  The results can take 1-3 weeks to receive.    You may resume your previous medication schedule.  For today please follow the post dilation diet provided which consists of clear liquids until 3:40pm and then soft foods for the rest of today.  Thank you for allowing [place] to care for you today!!!   YOU HAD AN ENDOSCOPIC PROCEDURE TODAY AT [facility]:   Refer to the procedure report that was given to you for any specific questions about what was found during the examination.  If the procedure report does not answer your questions, please call your gastroenterologist to clarify.  If you requested that your care partner not be given the details of your procedure findings, then the procedure report has been included in a sealed envelope for you to review at your convenience later.  YOU SHOULD EXPECT: Some feelings of bloating in the abdomen. Passage of more gas than usual.  Walking can help get rid of the air that was put into your GI tract during the procedure and reduce the bloating.   Please Note:  You might notice some irritation and congestion in your nose or some drainage.  This is from the oxygen used during your procedure.  There is no need for concern and it should clear up in a day or so.  SYMPTOMS TO REPORT IMMEDIATELY:   Following upper endoscopy (EGD)  Vomiting of blood or coffee ground material  New chest pain or pain under the shoulder blades  Painful or persistently difficult swallowing  New shortness of breath  Fever of 100F or higher  Black, tarry-looking stools  For urgent or emergent issues, a gastroenterologist can be reached at any hour by calling [phone]. Do not use MyChart messaging for urgent concerns.    DIET:  We do recommend post dilation diet for today, but then you may proceed to your regular diet on  tomorrow.  Drink plenty of fluids but you should avoid alcoholic beverages for 24 hours.  ACTIVITY:  You should plan to take it easy for the rest of today and you should NOT DRIVE or use heavy machinery until tomorrow (because of the sedation medicines used during the test).    FOLLOW UP: Our staff will call the number listed on your records 48-72 hours following your procedure to check on you and address any questions or concerns that you may have regarding the information given to you following your procedure. If we do not reach you, we will leave a message.  We will attempt to reach you two times.  During this call, we will ask if you have developed any symptoms of COVID 19. If you develop any symptoms (ie: fever, flu-like symptoms, shortness of breath, cough etc.) before then, please call [phone].  If you test positive for Covid 19 in the 2 weeks post procedure, please call and report this information to [place].    If any biopsies were taken you will be contacted by phone or by letter within the next 1-3 weeks.  Please call us at [phone] if you have not heard about the biopsies in 3 weeks.    SIGNATURES/CONFIDENTIALITY: You and/or your care partner have signed paperwork which will be entered into your electronic medical record.  These signatures attest to the fact that that the information above on your After Visit Summary has  been reviewed and is understood.  Full responsibility of the confidentiality of this discharge information lies with you and/or your care-partner.

## 2019-10-21 ENCOUNTER — Telehealth: Payer: Self-pay

## 2019-10-21 NOTE — Telephone Encounter (Signed)
Phone was cut off and unable to speak with patient on follow up call.

## 2019-10-21 NOTE — Telephone Encounter (Signed)
No answer, unable to leave a message, B.Miroslav Gin RN. 

## 2019-10-28 ENCOUNTER — Encounter: Payer: Self-pay | Admitting: Gastroenterology

## 2021-06-16 DIAGNOSIS — E782 Mixed hyperlipidemia: Secondary | ICD-10-CM | POA: Diagnosis not present

## 2021-06-16 DIAGNOSIS — E291 Testicular hypofunction: Secondary | ICD-10-CM | POA: Diagnosis not present

## 2021-06-16 DIAGNOSIS — Z0001 Encounter for general adult medical examination with abnormal findings: Secondary | ICD-10-CM | POA: Diagnosis not present

## 2021-06-16 DIAGNOSIS — Z6826 Body mass index (BMI) 26.0-26.9, adult: Secondary | ICD-10-CM | POA: Diagnosis not present

## 2021-06-23 ENCOUNTER — Other Ambulatory Visit: Payer: Self-pay | Admitting: Family Medicine

## 2021-06-23 ENCOUNTER — Other Ambulatory Visit (HOSPITAL_COMMUNITY): Payer: Self-pay | Admitting: Family Medicine

## 2021-06-23 DIAGNOSIS — R748 Abnormal levels of other serum enzymes: Secondary | ICD-10-CM

## 2021-07-11 ENCOUNTER — Ambulatory Visit (HOSPITAL_COMMUNITY)
Admission: RE | Admit: 2021-07-11 | Discharge: 2021-07-11 | Disposition: A | Discharge status: Home / Self Care | Payer: BC Managed Care – PPO | Source: Ambulatory Visit | Attending: Family Medicine | Admitting: Family Medicine

## 2021-07-11 DIAGNOSIS — R748 Abnormal levels of other serum enzymes: Secondary | ICD-10-CM | POA: Diagnosis not present

## 2021-08-28 IMAGING — DX DG CHEST 2V
3 series · 3 of 3 positions shown · non-contrast
Comparison: None.

CLINICAL DATA: Chest pain.

EXAM:
CHEST - 2 VIEW

[chest pa]
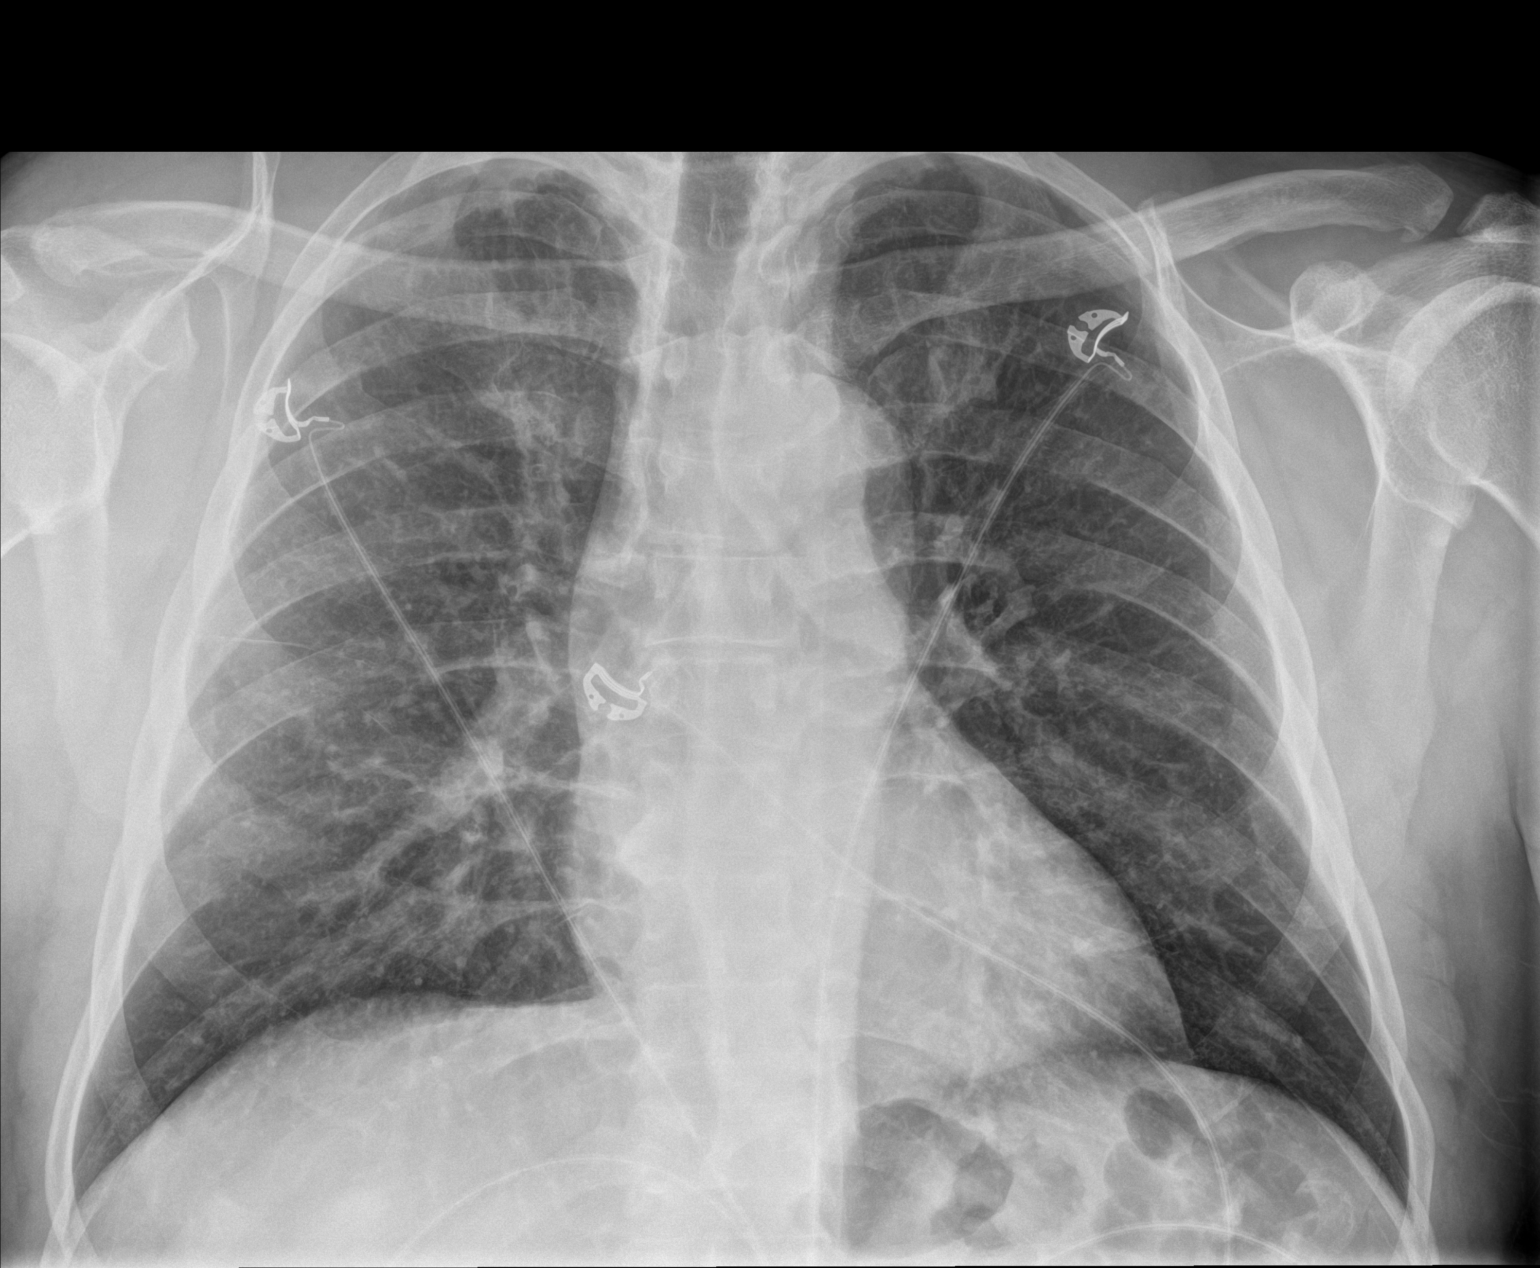

[chest lat (1 of 2)]
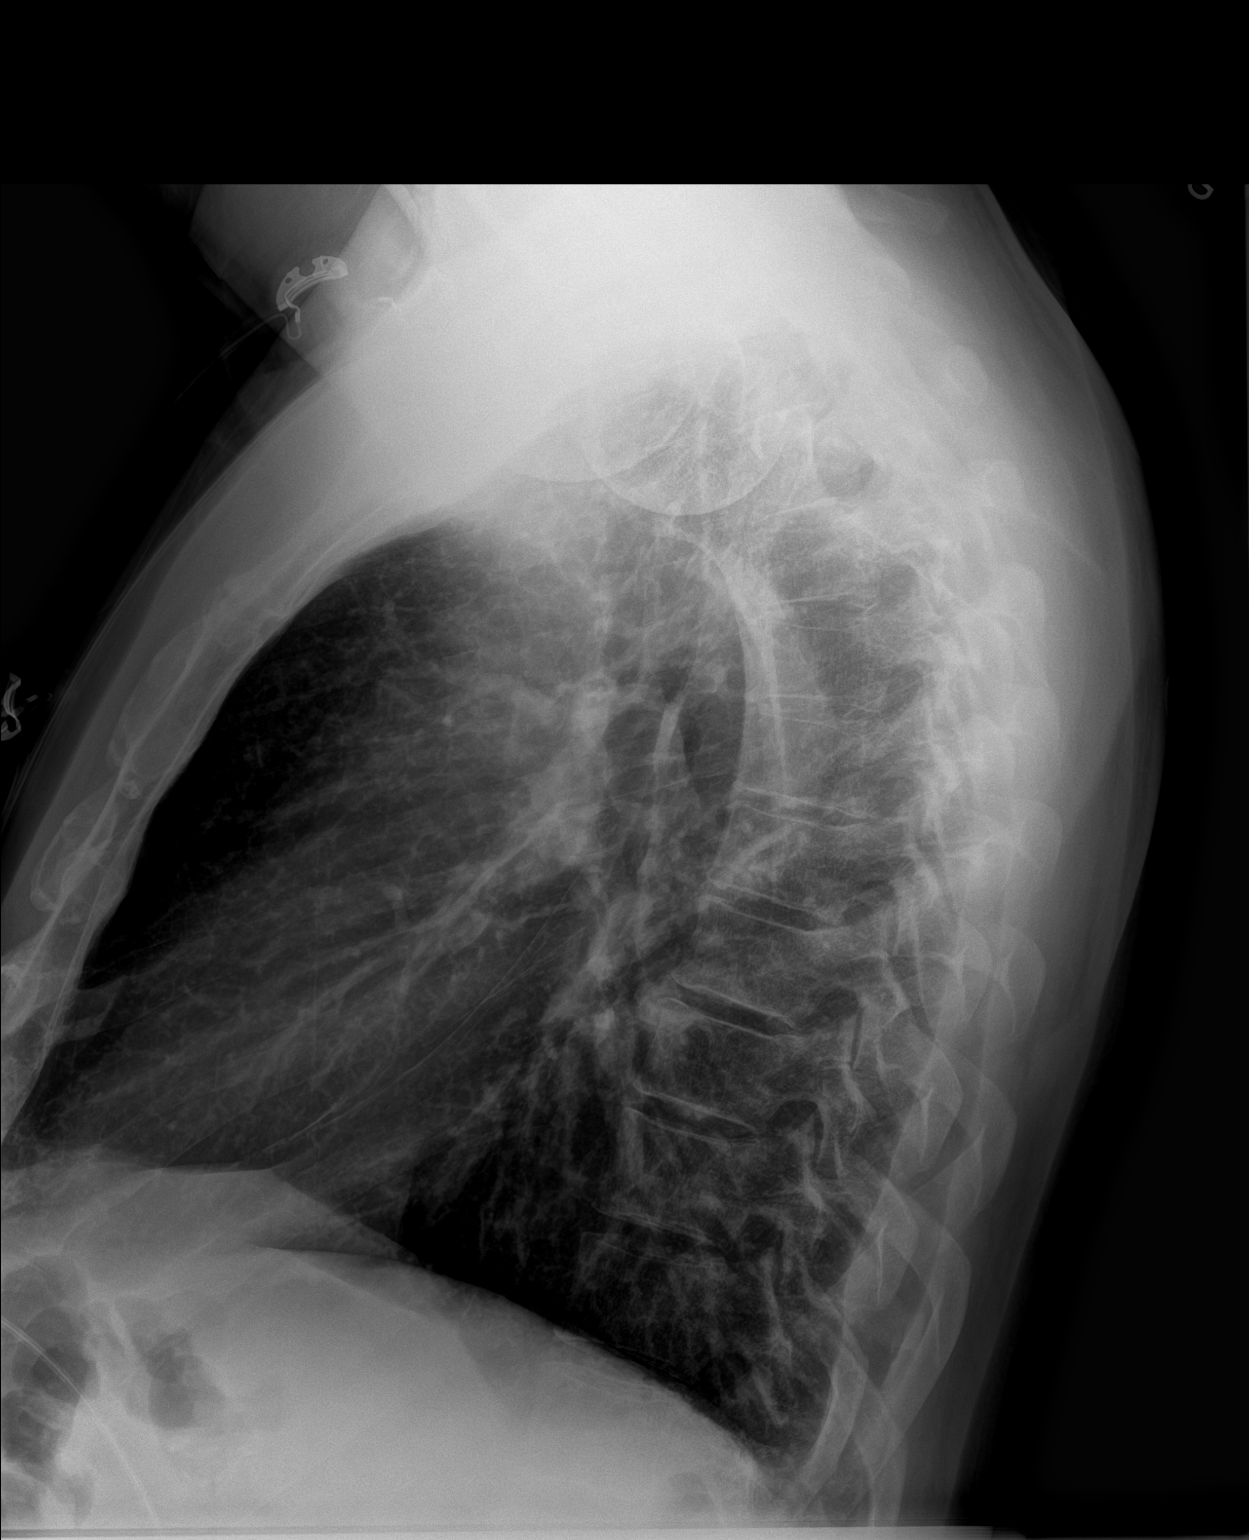

[chest lat (2 of 2)]
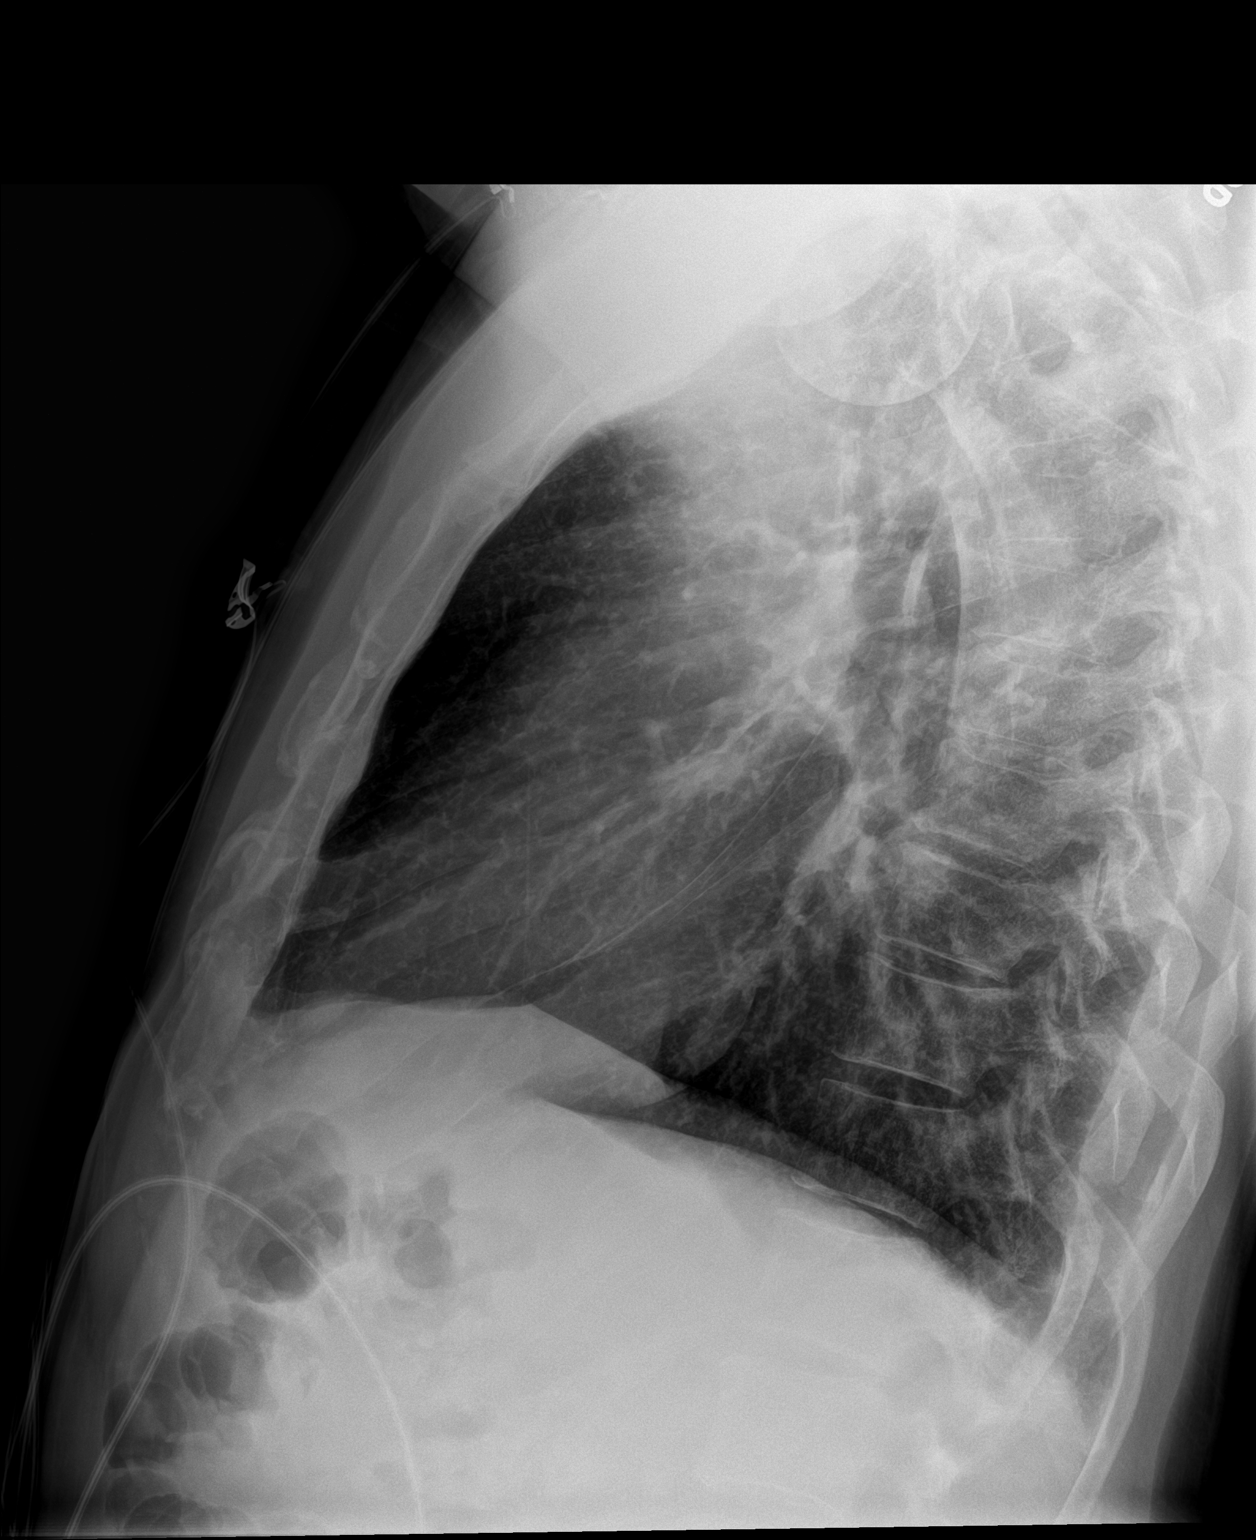

[3 of 3 positions shown; findings below may reference images not displayed]

FINDINGS: Cardiomediastinal silhouette is normal. Mediastinal contours appear
intact. Ectasia of the aorta.

There is no evidence of focal airspace consolidation, pleural
effusion or pneumothorax.

Osseous structures are without acute abnormality. Soft tissues are
grossly normal.
IMPRESSION: No active cardiopulmonary disease.

## 2021-11-13 ENCOUNTER — Encounter: Payer: Self-pay | Admitting: Gastroenterology

## 2021-11-13 ENCOUNTER — Ambulatory Visit: Payer: BC Managed Care – PPO | Admitting: Gastroenterology

## 2021-11-13 VITALS — BP 160/77 | HR 92 | Wt 204.0 lb

## 2021-11-13 DIAGNOSIS — R1310 Dysphagia, unspecified: Secondary | ICD-10-CM | POA: Diagnosis not present

## 2021-11-13 DIAGNOSIS — Z23 Encounter for immunization: Secondary | ICD-10-CM

## 2021-11-13 DIAGNOSIS — K222 Esophageal obstruction: Secondary | ICD-10-CM | POA: Diagnosis not present

## 2021-11-13 MED ORDER — OMEPRAZOLE 20 MG PO CPDR: 20.0000 mg | DELAYED_RELEASE_CAPSULE | Freq: Every day | ORAL | 3 refills | Status: DC

## 2021-11-13 NOTE — Patient Instructions (Addendum)
If you are age 56 or older, your body mass index should be between 23-30. Your Body mass index is 26.91 kg/m. If this is out of the aforementioned range listed, please consider follow up with your Primary Care Provider.  If you are age 39 or younger, your body mass index should be between 19-25. Your Body mass index is 26.91 kg/m. If this is out of the aformentioned range listed, please consider follow up with your Primary Care Provider.   ________________________________________________________   Leon Brown have been scheduled for an endoscopy. Please follow written instructions given to you at your visit today. If you use inhalers (even only as needed), please bring them with you on the day of your procedure.  We have sent the following medications to your pharmacy for you to pick up at your convenience: Omeprazole 20 mg: Take once daily  Thank you for entrusting me with your care and for choosing [facility], Dr. [place] Cellar

## 2021-11-13 NOTE — Progress Notes (Signed)
HPI :  56 year old male whom I know for history of dysphagia secondary to esophageal stricture, here for a follow-up visit for recurrent dysphagia.  Recall I saw him about 2 years ago for solid food dysphagia.  He had an EGD with me in October 2021 showing a moderate stenosis at the GEJ that was dilated with a balloon to 13 mm with an appropriate mucosal rent.  Biopsies of the stricture were taken and were benign and showed no concerning changes, no evidence of eosinophilic esophagitis as well.  I have not seen him since that time.  He states the dilation definitely helped his symptoms and he was doing okay for period of time, however his dysphagia has recurred over the past year or so.  He states this will typically occur with solids, can often get stuck in his lower esophagus and then he tries to drink water to push it through.  Sometimes in the situation the water will regurgitate back out and he has to wait for the food to pass on its own.  He denies any dysphagia to liquids at baseline without being proceeded by food intake.  A lot of his diet is made up of chicken and beef, this is the most common precipitant of his symptoms.  He denies any reflux symptoms at baseline.  Occasionally he will have a little heartburn.  He does not use anything over-the-counter for this currently.  Previously had recommended him to use some Prilosec after his last endoscopy.  He does take an aspirin daily.  Denies any abdominal pains or weight loss otherwise.  Since his symptoms started recurring a year ago, it appears to be progressively getting worse over time.  He states some weeks he will have symptoms every day, then followed by a few days of not having it as bad but he typically will have symptoms that bother him the most days.  He denies any significant changes to his health since I have last seen him.  Denies any cardiopulmonary symptoms.   His bowels are regular.  He had a colonoscopy with me   Prior  endoscopic evaluation: EGD 10/19/2019: - A 2 cm hiatal hernia was present. - One benign-appearing, intrinsic moderate stenosis was found 40 cm from the incisors. This stenosis measured less than one cm (in length). A TTS dilator was passed through the scope. Dilation with an 11-13-11 mm balloon dilator was performed to 11 mm, 12 mm and 13 mm and an appropriate mucosal wrent was noted. Biopsies were taken with a cold forceps for histology and to open the stricture further. - The exam of the esophagus was otherwise normal. - The entire examined stomach was normal. - The duodenal bulb and second portion of the duodenum were normal.  Surgical [P], esophageal, stricture bx - REACTIVE SQUAMOUS MUCOSA WITH CHRONIC INFLAMMATION - NO INCREASED INTRAEPITHELIAL EOSINOPHILS - SEE COMMENT   Colonoscopy 09/14/19 High risk colon cancer surveillance: Personal history of colonic polyps (colonoscopy 06/2016 - 10 polyps removed, most adenomas) The perianal and digital rectal examinations were normal. - A 5 mm polyp was found in the transverse colon. The polyp was sessile. The polyp was removed with a cold snare. Resection and retrieval were complete. - A 3 to 4 mm polyp was found in the descending colon. The polyp was sessile. The polyp was removed with a cold snare. Resection and retrieval were complete. - Internal hemorrhoids were found during retroflexion. - The exam was otherwise without abnormality.  Surgical [P], colon, descending  and transverse, polyp (2) - TUBULAR ADENOMA, NEGATIVE FOR HIGH GRADE DYSPLASIA (X2).  Repeat exam in 5 years   Past Medical History:  Diagnosis Date   COVID-19 11/2018   Elevated liver enzymes    Heartburn    Hyperlipidemia    Pneumothorax    Seasonal allergies      Past Surgical History:  Procedure Laterality Date   CHEST TUBE INSERTION Left 1995   COLONOSCOPY  06/29/2016   KNEE ARTHROSCOPY     Family History  Problem Relation Age of Onset    Hypertension Brother    Colon cancer Neg Hx    Esophageal cancer Neg Hx    Rectal cancer Neg Hx    Stomach cancer Neg Hx    Colon polyps Neg Hx    Social History   Tobacco Use   Smoking status: Former    Types: Cigarettes    Quit date: 01/01/1993    Years since quitting: 28.8   Smokeless tobacco: Never  Vaping Use   Vaping Use: Never used  Substance Use Topics   Alcohol use: Yes    Comment: 4 oz liquior daily per patient/763m per week   Drug use: No   Current Outpatient Medications  Medication Sig Dispense Refill   aspirin EC 325 MG tablet Take 325 mg by mouth daily.     Multiple Vitamin (MULTIVITAMIN) tablet Take 1 tablet by mouth daily.     omeprazole (PRILOSEC OTC) 20 MG tablet Take 20 mg by mouth daily.     testosterone cypionate (DEPOTESTOSTERONE CYPIONATE) 200 MG/ML injection Inject into the muscle every 14 (fourteen) days.     No current facility-administered medications for this visit.   No Known Allergies   Review of Systems: All systems reviewed and negative except where noted in HPI.    Physical Exam: BP (!) 160/77   Pulse 92   Wt 204 lb (92.5 kg)   SpO2 98%   BMI 26.91 kg/m  Constitutional: Pleasant,well-developed, male in no acute distress. Neurological: Alert and oriented to person place and time. Psychiatric: Normal mood and affect. Behavior is normal.   ASSESSMENT: 56y.o. male here for assessment of the following  1. Dysphagia, unspecified type   2. 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  He had a fairly tight stricture at the last endoscopy, could only open it to 13 mm with a balloon at the time.  Recommend an EGD to further evaluate and dilate further with balloon if amenable.  I discussed what EGD is, risks of the exam and anesthesia as well as dilation, following this discussion he wants to proceed.  In the interim recommend starting  omeprazole 20 mg every day and will likely continue this post dilation as well.  No evidence of EoE on the last exam, will reassess for that.  I counseled him that hopefully with 1 dilation we are able to reduce his symptoms if not resolved them, however if he has a very tight stricture on this exam he may need a few dilations to get him to a diameter where his symptoms are completely resolved.  He understands this.  Further recommendations pending the results.  [facility]   [facility] - start omeprazole '20mg'$  / day  -  monitor diet - chew food well, especially meat / chicken / [place], no large pieces, to reduce risk for impaction  Jolly Mango, MD [facility]

## 2021-11-21 DIAGNOSIS — E663 Overweight: Secondary | ICD-10-CM | POA: Diagnosis not present

## 2021-11-21 DIAGNOSIS — Z6827 Body mass index (BMI) 27.0-27.9, adult: Secondary | ICD-10-CM | POA: Diagnosis not present

## 2021-11-21 DIAGNOSIS — S46001A Unspecified injury of muscle(s) and tendon(s) of the rotator cuff of right shoulder, initial encounter: Secondary | ICD-10-CM | POA: Diagnosis not present

## 2021-11-27 ENCOUNTER — Ambulatory Visit (AMBULATORY_SURGERY_CENTER): Payer: BC Managed Care – PPO | Admitting: Gastroenterology

## 2021-11-27 ENCOUNTER — Encounter: Payer: Self-pay | Admitting: Gastroenterology

## 2021-11-27 VITALS — BP 148/97 | HR 62 | Temp 98.4°F | Resp 16 | Ht 71.5 in | Wt 203.0 lb

## 2021-11-27 DIAGNOSIS — K222 Esophageal obstruction: Secondary | ICD-10-CM | POA: Diagnosis not present

## 2021-11-27 DIAGNOSIS — R1310 Dysphagia, unspecified: Secondary | ICD-10-CM

## 2021-11-27 MED ORDER — SODIUM CHLORIDE 0.9 % IV SOLN: 500.0000 mL | Freq: Once | INTRAVENOUS | Status: AC

## 2021-11-27 NOTE — Patient Instructions (Signed)
Impression/Recommendations:  Hiatal hernia, esophageal stricture, and dilation diet handouts given to patient.  Follow post dilation diet.  Continue present medications.  Follow-up as needed for recurrent symptoms.  YOU HAD AN ENDOSCOPIC PROCEDURE TODAY AT [facility]:   Refer to the procedure report that was given to you for any specific questions about what was found during the examination.  If the procedure report does not answer your questions, please call your gastroenterologist to clarify.  If you requested that your care partner not be given the details of your procedure findings, then the procedure report has been included in a sealed envelope for you to review at your convenience later.  YOU SHOULD EXPECT: Some feelings of bloating in the abdomen. Passage of more gas than usual.  Walking can help get rid of the air that was put into your GI tract during the procedure and reduce the bloating. If you had a lower endoscopy (such as a colonoscopy or flexible sigmoidoscopy) you may notice spotting of blood in your stool or on the toilet paper. If you underwent a bowel prep for your procedure, you may not have a normal bowel movement for a few days.  Please Note:  You might notice some irritation and congestion in your nose or some drainage.  This is from the oxygen used during your procedure.  There is no need for concern and it should clear up in a day or so.  SYMPTOMS TO REPORT IMMEDIATELY: Following upper endoscopy (EGD)  Vomiting of blood or coffee ground material  New chest pain or pain under the shoulder blades  Painful or persistently difficult swallowing  New shortness of breath  Fever of 100F or higher  Black, tarry-looking stools  For urgent or emergent issues, a gastroenterologist can be reached at any hour by calling [phone]. Do not use MyChart messaging for urgent concerns.    DIET:  We do recommend a small meal at first, but then you may proceed  to your regular diet.  Drink plenty of fluids but you should avoid alcoholic beverages for 24 hours.  ACTIVITY:  You should plan to take it easy for the rest of today and you should NOT DRIVE or use heavy machinery until tomorrow (because of the sedation medicines used during the test).    FOLLOW UP: Our staff will call the number listed on your records the next business day following your procedure.  We will call around 7:15- 8:00 am to check on you and address any questions or concerns that you may have regarding the information given to you following your procedure. If we do not reach you, we will leave a message.     If any biopsies were taken you will be contacted by phone or by letter within the next 1-3 weeks.  Please call us at [phone] if you have not heard about the biopsies in 3 weeks.    SIGNATURES/CONFIDENTIALITY: You and/or your care partner have signed paperwork which will be entered into your electronic medical record.  These signatures attest to the fact that that the information above on your After Visit Summary has been reviewed and is understood.  Full responsibility of the confidentiality of this discharge information lies with you and/or your care-partner.

## 2021-11-27 NOTE — Progress Notes (Signed)
History and Physical Interval Note: See on 11/13/21 - no interval changes. EGD with dilation to treat known esophageal stricture and recurrent dysphagia. Now on omeprazole. He wishes to proceed after discussion of risks  benefits.     11/27/2021 3:58 PM  Leon Brown  has presented today for endoscopic procedure(s), with the diagnosis of  Encounter Diagnosis  Name Primary?   Dysphagia, unspecified type Yes  .  The various methods of evaluation and treatment have been discussed with the patient and/or family. After consideration of risks, benefits and other options for treatment, the patient has consented to  the endoscopic procedure(s).   The patient's history has been reviewed, patient examined, no change in status, stable for surgery.  I have reviewed the patient's chart and labs.  Questions were answered to the patient's satisfaction.    Jolly Mango, MD [facility]

## 2021-11-27 NOTE — Progress Notes (Signed)
Called to room to assist during endoscopic procedure.  Patient ID and intended procedure confirmed with present staff. Received instructions for my participation in the procedure from the performing physician.  

## 2021-11-27 NOTE — Op Note (Signed)
[place] Patient Name: Leon Brown Procedure Date: 11/27/2021 4:00 PM MRN: [id number] Endoscopist: Remo Lipps P. Havery Moros , MD, [id number] Age: 56 Referring MD:  Date of Birth: 06-29-1965 Gender: Male Account #: [id number] Procedure:                Upper GI endoscopy Indications:              Dysphagia - history of GEJ stricture dilated to                            69m in 10/2019. Now with recurrent symptoms Medicines:                Monitored Anesthesia Care Procedure:                Pre-Anesthesia Assessment:                           - Prior to the procedure, a History and Physical                            was performed, and patient medications and                            allergies were reviewed. The patient's tolerance of                            previous anesthesia was also reviewed. The risks                            and benefits of the procedure and the sedation                            options and risks were discussed with the patient.                            All questions were answered, and informed consent                            was obtained. Prior Anticoagulants: The patient has                            taken no anticoagulant or antiplatelet agents. ASA                            Grade Assessment: II - A patient with mild systemic                            disease. After reviewing the risks and benefits,                            the patient was deemed in satisfactory condition to                            undergo the procedure.  After obtaining informed consent, the endoscope was                            passed under direct vision. Throughout the                            procedure, the patient's blood pressure, pulse, and                            oxygen saturations were monitored continuously. The                            GIF [id number] #[id number] was introduced through the                            mouth,  and advanced to the second part of duodenum.                            The upper GI endoscopy was accomplished without                            difficulty. The patient tolerated the procedure                            well. Scope In: Scope Out: Findings:                 Esophagogastric landmarks were identified: the                            Z-line was found at 43 cm, the gastroesophageal                            junction was found at 43 cm and the upper extent of                            the gastric folds was found at 45 cm from the                            incisors.                           A 2 cm hiatal hernia was present.                           One benign-appearing, intrinsic stenosis was found.                            This stenosis measured less than one cm (in                            length). A TTS dilator was passed through the                            scope.  Dilation with an 11-13-11 mm balloon dilator                            was performed to 11 mm, 12 mm, 13 mm initially but                            no mucosal wrents noted. A larger balloon was then                            placed and dilated to 15 mm after which 2                            appropriate mucosal wrents were noted. The                            stricture was then biopsied with a cold forceps to                            open the stricture further (not sent for path,                            benign appearing, previously biopsied and negative)                           The exam of the esophagus was otherwise normal.                           The entire examined stomach was normal.                           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                           The exam of the duodenum was otherwise normal. Complications:            No immediate  complications. Estimated blood loss:                            Minimal. Estimated Blood Loss:     Estimated blood loss was minimal. Impression:               - Esophagogastric landmarks identified.                           - 2 cm hiatal hernia.                           - Benign-appearing esophageal stenosis. Dilated to  87m with good result and biopsied to open it                            further.                           - Normal stomach.                           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 Recommendation:           - Patient has a contact number available for                            emergencies. The signs and symptoms of potential                            delayed complications were discussed with the                            patient. Return to normal activities tomorrow.                            Written discharge instructions were provided to the                            patient.                           - Post dilation diet.                           - Continue present medications.                           - Follow up as needed for recurrent symptoms SRemo LippsP. [name], MD 11/27/2021 4:26:57 PM This report has been signed electronically.

## 2021-11-27 NOTE — Progress Notes (Signed)
To pacu, VSS. Report to [email or web address].tb 

## 2021-11-28 ENCOUNTER — Telehealth: Payer: Self-pay | Admitting: *Deleted

## 2021-11-28 NOTE — Telephone Encounter (Signed)
Post procedure follow up call placed, no answer and left VM.  

## 2021-12-14 DIAGNOSIS — E7849 Other hyperlipidemia: Secondary | ICD-10-CM | POA: Diagnosis not present

## 2021-12-14 DIAGNOSIS — E782 Mixed hyperlipidemia: Secondary | ICD-10-CM | POA: Diagnosis not present

## 2021-12-14 DIAGNOSIS — R748 Abnormal levels of other serum enzymes: Secondary | ICD-10-CM | POA: Diagnosis not present

## 2021-12-14 DIAGNOSIS — Z0001 Encounter for general adult medical examination with abnormal findings: Secondary | ICD-10-CM | POA: Diagnosis not present

## 2021-12-14 DIAGNOSIS — Z6827 Body mass index (BMI) 27.0-27.9, adult: Secondary | ICD-10-CM | POA: Diagnosis not present

## 2021-12-14 DIAGNOSIS — Z1331 Encounter for screening for depression: Secondary | ICD-10-CM | POA: Diagnosis not present

## 2021-12-14 DIAGNOSIS — M25511 Pain in right shoulder: Secondary | ICD-10-CM | POA: Diagnosis not present

## 2021-12-14 DIAGNOSIS — E781 Pure hyperglyceridemia: Secondary | ICD-10-CM | POA: Diagnosis not present

## 2021-12-14 DIAGNOSIS — E291 Testicular hypofunction: Secondary | ICD-10-CM | POA: Diagnosis not present

## 2021-12-26 DIAGNOSIS — R293 Abnormal posture: Secondary | ICD-10-CM | POA: Diagnosis not present

## 2021-12-26 DIAGNOSIS — M25611 Stiffness of right shoulder, not elsewhere classified: Secondary | ICD-10-CM | POA: Diagnosis not present

## 2021-12-26 DIAGNOSIS — M25511 Pain in right shoulder: Secondary | ICD-10-CM | POA: Diagnosis not present

## 2021-12-26 DIAGNOSIS — M62511 Muscle wasting and atrophy, not elsewhere classified, right shoulder: Secondary | ICD-10-CM | POA: Diagnosis not present

## 2022-03-04 ENCOUNTER — Other Ambulatory Visit: Payer: Self-pay | Admitting: Gastroenterology

## 2022-03-21 DIAGNOSIS — Z6830 Body mass index (BMI) 30.0-30.9, adult: Secondary | ICD-10-CM | POA: Diagnosis not present

## 2022-03-21 DIAGNOSIS — M7022 Olecranon bursitis, left elbow: Secondary | ICD-10-CM | POA: Diagnosis not present

## 2022-03-21 DIAGNOSIS — L03114 Cellulitis of left upper limb: Secondary | ICD-10-CM | POA: Diagnosis not present

## 2022-03-21 DIAGNOSIS — E6609 Other obesity due to excess calories: Secondary | ICD-10-CM | POA: Diagnosis not present

## 2022-06-15 DIAGNOSIS — K219 Gastro-esophageal reflux disease without esophagitis: Secondary | ICD-10-CM | POA: Diagnosis not present

## 2022-06-15 DIAGNOSIS — E782 Mixed hyperlipidemia: Secondary | ICD-10-CM | POA: Diagnosis not present

## 2022-06-15 DIAGNOSIS — E291 Testicular hypofunction: Secondary | ICD-10-CM | POA: Diagnosis not present

## 2022-06-15 DIAGNOSIS — E663 Overweight: Secondary | ICD-10-CM | POA: Diagnosis not present

## 2022-06-15 DIAGNOSIS — Z6828 Body mass index (BMI) 28.0-28.9, adult: Secondary | ICD-10-CM | POA: Diagnosis not present

## 2022-06-15 DIAGNOSIS — E7849 Other hyperlipidemia: Secondary | ICD-10-CM | POA: Diagnosis not present

## 2022-06-15 DIAGNOSIS — R748 Abnormal levels of other serum enzymes: Secondary | ICD-10-CM | POA: Diagnosis not present

## 2022-06-19 DIAGNOSIS — M25511 Pain in right shoulder: Secondary | ICD-10-CM | POA: Diagnosis not present

## 2022-06-21 ENCOUNTER — Other Ambulatory Visit: Payer: Self-pay | Admitting: Otolaryngology

## 2022-06-21 DIAGNOSIS — M25511 Pain in right shoulder: Secondary | ICD-10-CM

## 2022-07-08 ENCOUNTER — Ambulatory Visit
Admission: RE | Admit: 2022-07-08 | Discharge: 2022-07-08 | Disposition: A | Discharge status: Home / Self Care | Payer: BC Managed Care – PPO | Source: Ambulatory Visit | Attending: Otolaryngology | Admitting: Otolaryngology

## 2022-07-08 DIAGNOSIS — M7581 Other shoulder lesions, right shoulder: Secondary | ICD-10-CM | POA: Diagnosis not present

## 2022-07-08 DIAGNOSIS — M25511 Pain in right shoulder: Secondary | ICD-10-CM

## 2022-07-08 DIAGNOSIS — S46111A Strain of muscle, fascia and tendon of long head of biceps, right arm, initial encounter: Secondary | ICD-10-CM | POA: Diagnosis not present

## 2022-07-08 DIAGNOSIS — S43431A Superior glenoid labrum lesion of right shoulder, initial encounter: Secondary | ICD-10-CM | POA: Diagnosis not present

## 2022-07-16 DIAGNOSIS — M25511 Pain in right shoulder: Secondary | ICD-10-CM | POA: Diagnosis not present

## 2022-07-19 ENCOUNTER — Other Ambulatory Visit: Payer: Self-pay | Admitting: Gastroenterology

## 2022-08-17 ENCOUNTER — Ambulatory Visit (HOSPITAL_COMMUNITY)
Admission: RE | Admit: 2022-08-17 | Discharge: 2022-08-17 | Disposition: A | Discharge status: Home / Self Care | Payer: BC Managed Care – PPO | Source: Ambulatory Visit | Attending: Family Medicine | Admitting: Family Medicine

## 2022-08-17 ENCOUNTER — Other Ambulatory Visit (HOSPITAL_COMMUNITY): Payer: Self-pay | Admitting: Family Medicine

## 2022-08-17 ENCOUNTER — Encounter (HOSPITAL_COMMUNITY): Payer: Self-pay | Admitting: Family Medicine

## 2022-08-17 DIAGNOSIS — E663 Overweight: Secondary | ICD-10-CM | POA: Diagnosis not present

## 2022-08-17 DIAGNOSIS — Z6827 Body mass index (BMI) 27.0-27.9, adult: Secondary | ICD-10-CM | POA: Diagnosis not present

## 2022-08-17 DIAGNOSIS — R0609 Other forms of dyspnea: Secondary | ICD-10-CM | POA: Insufficient documentation

## 2022-08-17 DIAGNOSIS — J069 Acute upper respiratory infection, unspecified: Secondary | ICD-10-CM | POA: Diagnosis not present

## 2022-08-17 DIAGNOSIS — Z20828 Contact with and (suspected) exposure to other viral communicable diseases: Secondary | ICD-10-CM | POA: Diagnosis not present

## 2022-08-20 ENCOUNTER — Other Ambulatory Visit: Payer: Self-pay | Admitting: Family Medicine

## 2022-08-20 DIAGNOSIS — R0609 Other forms of dyspnea: Secondary | ICD-10-CM

## 2022-08-20 DIAGNOSIS — R7989 Other specified abnormal findings of blood chemistry: Secondary | ICD-10-CM

## 2022-08-21 ENCOUNTER — Ambulatory Visit
Admission: RE | Admit: 2022-08-21 | Discharge: 2022-08-21 | Disposition: A | Discharge status: Home / Self Care | Payer: BC Managed Care – PPO | Source: Ambulatory Visit | Attending: Family Medicine | Admitting: Family Medicine

## 2022-08-21 DIAGNOSIS — R0609 Other forms of dyspnea: Secondary | ICD-10-CM

## 2022-08-21 DIAGNOSIS — R7989 Other specified abnormal findings of blood chemistry: Secondary | ICD-10-CM

## 2022-08-21 MED ORDER — IOPAMIDOL (ISOVUE-370) INJECTION 76%
200.0000 mL | Freq: Once | INTRAVENOUS | Status: AC | PRN
  Administered 2022-08-21: 75 mL via INTRAVENOUS

## 2022-09-14 DIAGNOSIS — Z6828 Body mass index (BMI) 28.0-28.9, adult: Secondary | ICD-10-CM | POA: Diagnosis not present

## 2022-09-14 DIAGNOSIS — E6609 Other obesity due to excess calories: Secondary | ICD-10-CM | POA: Diagnosis not present

## 2022-09-14 DIAGNOSIS — R748 Abnormal levels of other serum enzymes: Secondary | ICD-10-CM | POA: Diagnosis not present

## 2022-09-14 DIAGNOSIS — E291 Testicular hypofunction: Secondary | ICD-10-CM | POA: Diagnosis not present

## 2022-09-14 DIAGNOSIS — K760 Fatty (change of) liver, not elsewhere classified: Secondary | ICD-10-CM | POA: Diagnosis not present

## 2022-09-14 DIAGNOSIS — E782 Mixed hyperlipidemia: Secondary | ICD-10-CM | POA: Diagnosis not present

## 2022-09-14 DIAGNOSIS — I2693 Single subsegmental pulmonary embolism without acute cor pulmonale: Secondary | ICD-10-CM | POA: Diagnosis not present

## 2022-11-25 ENCOUNTER — Other Ambulatory Visit: Payer: Self-pay | Admitting: Gastroenterology

## 2024-01-20 ENCOUNTER — Encounter: Payer: Self-pay | Admitting: Internal Medicine

## 2024-02-06 ENCOUNTER — Other Ambulatory Visit: Payer: Self-pay

## 2024-02-06 ENCOUNTER — Observation Stay (HOSPITAL_COMMUNITY)
Admission: EM | Admit: 2024-02-06 | Disposition: A | Source: Home / Self Care | Attending: Emergency Medicine | Admitting: Emergency Medicine

## 2024-02-06 ENCOUNTER — Encounter (HOSPITAL_COMMUNITY): Payer: Self-pay | Admitting: Emergency Medicine

## 2024-02-06 ENCOUNTER — Emergency Department (HOSPITAL_COMMUNITY)

## 2024-02-06 DIAGNOSIS — K353 Acute appendicitis with localized peritonitis, without perforation or gangrene: Principal | ICD-10-CM

## 2024-02-06 DIAGNOSIS — K3580 Unspecified acute appendicitis: Secondary | ICD-10-CM | POA: Diagnosis present

## 2024-02-06 HISTORY — DX: Other complications of anesthesia, initial encounter: T88.59XA

## 2024-02-06 LAB — COMPREHENSIVE METABOLIC PANEL WITH GFR
ALT: 40 U/L (ref 0–44)
AST: 27 U/L (ref 15–41)
Albumin: 4.9 g/dL (ref 3.5–5.0)
Alkaline Phosphatase: 66 U/L (ref 38–126)
Anion gap: 15 (ref 5–15)
BUN: 18 mg/dL (ref 6–20)
CO2: 25 mmol/L (ref 22–32)
Calcium: 10 mg/dL (ref 8.9–10.3)
Chloride: 97 mmol/L — ABNORMAL LOW (ref 98–111)
Creatinine, Ser: 0.98 mg/dL (ref 0.61–1.24)
GFR, Estimated: >60 mL/min
Glucose, Bld: 109 mg/dL — ABNORMAL HIGH (ref 70–99)
Potassium: 4.9 mmol/L (ref 3.5–5.1)
Sodium: 136 mmol/L (ref 135–145)
Total Bilirubin: 1.1 mg/dL (ref 0.0–1.2)
Total Protein: 7.9 g/dL (ref 6.5–8.1)

## 2024-02-06 LAB — URINALYSIS, ROUTINE W REFLEX MICROSCOPIC
Bacteria, UA: NONE SEEN
Bilirubin Urine: NEGATIVE
Glucose, UA: NEGATIVE mg/dL
Ketones, ur: 5 mg/dL — AB
Leukocytes,Ua: NEGATIVE
Nitrite: NEGATIVE
Protein, ur: NEGATIVE mg/dL
Specific Gravity, Urine: 1.017 (ref 1.005–1.030)
pH: 7 (ref 5.0–8.0)

## 2024-02-06 LAB — CBC
HCT: 54.7 % — ABNORMAL HIGH (ref 39.0–52.0)
Hemoglobin: 18.8 g/dL — ABNORMAL HIGH (ref 13.0–17.0)
MCH: 33.1 pg (ref 26.0–34.0)
MCHC: 34.4 g/dL (ref 30.0–36.0)
MCV: 96.3 fL (ref 80.0–100.0)
Platelets: 258 10*3/uL (ref 150–400)
RBC: 5.68 MIL/uL (ref 4.22–5.81)
RDW: 11.9 % (ref 11.5–15.5)
WBC: 19.3 10*3/uL — ABNORMAL HIGH (ref 4.0–10.5)
nRBC: 0 % (ref 0.0–0.2)

## 2024-02-06 LAB — LIPASE, BLOOD: Lipase: 39 U/L (ref 11–51)

## 2024-02-06 MED ORDER — ONDANSETRON HCL 4 MG/2ML IJ SOLN: 4.0000 mg | Freq: Four times a day (QID) | INTRAMUSCULAR | Status: DC | PRN

## 2024-02-06 MED ORDER — MORPHINE SULFATE (PF) 4 MG/ML IV SOLN
4.0000 mg | INTRAVENOUS | Status: AC | PRN
  Administered 2024-02-06 – 2024-02-07 (×3): 4 mg via INTRAVENOUS
  Filled 2024-02-06 (×3): qty 1

## 2024-02-06 MED ORDER — SODIUM CHLORIDE 0.9 % IV BOLUS
1000.0000 mL | Freq: Once | INTRAVENOUS | Status: AC
  Administered 2024-02-06: 1000 mL via INTRAVENOUS

## 2024-02-06 MED ORDER — ACETAMINOPHEN 650 MG RE SUPP: 650.0000 mg | Freq: Four times a day (QID) | RECTAL | Status: AC | PRN

## 2024-02-06 MED ORDER — SODIUM CHLORIDE 0.9 % IV SOLN
2.0000 g | Freq: Once | INTRAVENOUS | Status: AC
  Administered 2024-02-06: 2 g via INTRAVENOUS
  Filled 2024-02-06: qty 20

## 2024-02-06 MED ORDER — METRONIDAZOLE 500 MG/100ML IV SOLN
500.0000 mg | Freq: Once | INTRAVENOUS | Status: AC
  Administered 2024-02-06: 500 mg via INTRAVENOUS
  Filled 2024-02-06: qty 100

## 2024-02-06 MED ORDER — SODIUM CHLORIDE 0.9 % IV SOLN: INTRAVENOUS | Status: DC

## 2024-02-06 MED ORDER — METRONIDAZOLE 500 MG/100ML IV SOLN
500.0000 mg | Freq: Two times a day (BID) | INTRAVENOUS | Status: AC
  Administered 2024-02-07: 500 mg via INTRAVENOUS
  Filled 2024-02-06: qty 100

## 2024-02-06 MED ORDER — ONDANSETRON HCL 4 MG PO TABS: 4.0000 mg | ORAL_TABLET | Freq: Four times a day (QID) | ORAL | Status: AC | PRN

## 2024-02-06 MED ORDER — ACETAMINOPHEN 325 MG PO TABS: 650.0000 mg | ORAL_TABLET | Freq: Four times a day (QID) | ORAL | Status: AC | PRN

## 2024-02-06 MED ORDER — SODIUM CHLORIDE 0.9 % IV SOLN: 2.0000 g | INTRAVENOUS | Status: AC

## 2024-02-06 MED ORDER — ONDANSETRON HCL 4 MG/2ML IJ SOLN
4.0000 mg | Freq: Four times a day (QID) | INTRAMUSCULAR | Status: AC | PRN
  Administered 2024-02-07: 4 mg via INTRAVENOUS

## 2024-02-06 MED ORDER — FENTANYL CITRATE (PF) 100 MCG/2ML IJ SOLN
50.0000 ug | Freq: Once | INTRAMUSCULAR | Status: AC
  Administered 2024-02-06: 50 ug via INTRAVENOUS
  Filled 2024-02-06: qty 2

## 2024-02-06 MED ORDER — POLYETHYLENE GLYCOL 3350 17 G PO PACK: 17.0000 g | PACK | Freq: Every day | ORAL | Status: AC | PRN

## 2024-02-06 MED ORDER — ONDANSETRON HCL 4 MG/2ML IJ SOLN
4.0000 mg | Freq: Once | INTRAMUSCULAR | Status: AC
  Administered 2024-02-06: 4 mg via INTRAVENOUS
  Filled 2024-02-06: qty 2

## 2024-02-06 MED ORDER — IOHEXOL 300 MG/ML  SOLN
100.0000 mL | Freq: Once | INTRAMUSCULAR | Status: AC | PRN
  Administered 2024-02-06: 100 mL via INTRAVENOUS

## 2024-02-06 NOTE — ED Provider Notes (Signed)
 " [facility] EMERGENCY DEPARTMENT AT [facility] Provider Note   CSN: [id number] Arrival date & time: 02/06/24  1732     Patient presents with: Abdominal Pain   Leon Brown is a 59 y.o. male.    Abdominal Pain Patient presents with right lower quadrant abdominal pain.  Began yesterday somewhat diffuse but then localized to right lower quadrant.  Decreased appetite.  No fevers.  Has had some vomiting.  Has really not eaten much at all today.    Past Medical History:  Diagnosis Date   COVID-19 11/2018   Elevated liver enzymes    Heartburn    Hyperlipidemia    Pneumothorax    Seasonal allergies     Prior to Admission medications  Medication Sig Start Date End Date Taking? Authorizing Provider  aspirin EC 325 MG tablet Take 325 mg by mouth daily.    [provider]  Multiple Vitamin (MULTIVITAMIN) tablet Take 1 tablet by mouth daily.    [provider]  omeprazole  (PRILOSEC) 20 MG capsule TAKE ONE CAPSULE BY MOUTH ONCE DAILY 07/19/22   Armbruster, Elspeth SQUIBB, MD  testosterone  cypionate (DEPOTESTOSTERONE CYPIONATE) 200 MG/ML injection Inject into the muscle every 14 (fourteen) days.    [provider]    Allergies: Patient has no known allergies.    Review of Systems  Gastrointestinal:  Positive for abdominal pain.    Updated Vital Signs BP (!) 156/102 (BP Location: Right Arm)   Pulse 92   Temp 98.3 F (36.8 C) (Oral)   Resp (!) 22   Ht 6' 2 (1.88 m)   Wt 95.3 kg   SpO2 100%   BMI 26.96 kg/m   Physical Exam Vitals and nursing note reviewed.  Cardiovascular:     Rate and Rhythm: Normal rate.  Abdominal:     Tenderness: There is abdominal tenderness.     Comments: Right quadrant tenderness.  No mass palpated.  Neurological:     Mental Status: He is alert.     (all labs ordered are listed, but only abnormal results are displayed) Labs Reviewed  COMPREHENSIVE METABOLIC PANEL WITH GFR - Abnormal; Notable for the following  components:      Result Value   Chloride 97 (*)    Glucose, Bld 109 (*)    All other components within normal limits  CBC - Abnormal; Notable for the following components:   WBC 19.3 (*)    Hemoglobin 18.8 (*)    HCT 54.7 (*)    All other components within normal limits  URINALYSIS, ROUTINE W REFLEX MICROSCOPIC - Abnormal; Notable for the following components:   Hgb urine dipstick SMALL (*)    Ketones, ur 5 (*)    All other components within normal limits  LIPASE, BLOOD    EKG: None  Radiology: CT ABDOMEN PELVIS W CONTRAST Result Date: 02/06/2024 EXAM: CT ABDOMEN AND PELVIS WITH CONTRAST 02/06/2024 08:08:51 PM TECHNIQUE: CT of the abdomen and pelvis was performed with the administration of 100 mL of iohexol  (OMNIPAQUE ) 300 MG/ML solution. Multiplanar reformatted images are provided for review. Automated exposure control, iterative reconstruction, and/or weight-based adjustment of the mA/kV was utilized to reduce the radiation dose to as low as reasonably achievable. COMPARISON: None available. CLINICAL HISTORY: Right lower quadrant abdominal pain. FINDINGS: LOWER CHEST: Mild subpleural atelectasis/scarring in posterior right lung base. LIVER: The liver is unremarkable. GALLBLADDER AND BILE DUCTS: Gallbladder is unremarkable. No biliary ductal dilatation. SPLEEN: No acute abnormality. PANCREAS: No acute abnormality. ADRENAL GLANDS:  No acute abnormality. KIDNEYS, URETERS AND BLADDER: No stones in the kidneys or ureters. No hydronephrosis. No perinephric or periureteral stranding. Urinary bladder is unremarkable. GI AND BOWEL: Stomach demonstrates no acute abnormality. Dilated, abnormal appendix, measuring 14 mm, with periappendiceal stranding, reflecting acute appendicitis. Early adjacent phlegmonous changes. No drainable fluid collection/abscess. There is no bowel obstruction. PERITONEUM AND RETROPERITONEUM: No ascites. No free air. VASCULATURE: Aorta is normal in caliber. Mild aortoiliac  atherosclerotic calcification. LYMPH NODES: No lymphadenopathy. REPRODUCTIVE ORGANS: No acute abnormality. BONES AND SOFT TISSUES: No acute osseous abnormality. Small fat-containing umbilical hernia. No focal soft tissue abnormality. IMPRESSION: 1. Acute appendicitis with early adjacent phlegmonous changes. No drainable fluid collection or free air. 2. Aortic Atherosclerosis (ICD10-I70.0). Electronically signed by: Pinkie Pebbles MD 02/06/2024 08:16 PM EST RP Workstation: [id number]     Procedures   Medications Ordered in the ED  cefTRIAXone  (ROCEPHIN ) 2 g in sodium chloride  0.9 % 100 mL IVPB (0 g Intravenous Stopped 02/06/24 2007)    And  metroNIDAZOLE  (FLAGYL ) IVPB 500 mg (500 mg Intravenous New Bag/Given 02/06/24 1938)  fentaNYL  (SUBLIMAZE ) injection 50 mcg (50 mcg Intravenous Given 02/06/24 1933)  ondansetron  (ZOFRAN ) injection 4 mg (4 mg Intravenous Given 02/06/24 1934)  iohexol  (OMNIPAQUE ) 300 MG/ML solution 100 mL (100 mLs Intravenous Contrast Given 02/06/24 1951)  sodium chloride  0.9 % bolus 1,000 mL (1,000 mLs Intravenous New Bag/Given 02/06/24 2005)                                    Medical Decision Making Amount and/or Complexity of Data Reviewed Labs: ordered. Radiology: ordered.  Risk Prescription drug management. Decision regarding hospitalization.   Patient abdominal pain.  Right lower quadrant tenderness.  Differential diagnosis is relatively long but likely highest on the list is appendicitis.  Colitis and obstruction also considered.  However I think it is pretest probability is high enough for an appendicitis and antibiotics been given.  Will get CT scan and pain medicines.  Will give antiemetics.  White count elevated as is his hemoglobin.  Will give fluid bolus.  CT scan independently interpreted and then read radiology read.  Has acute appendicitis with some phlegmon around it.  Nothing to drain.  Discussed with Dr. Mavis from general surgery.  Recommends admission to  hospitalist under observation status.  Likely OR at 9 AM tomorrow.  Antibiotics.  N.p.o. at midnight.     Final diagnoses:  Acute appendicitis with localized peritonitis without abscess, unspecified whether gangrene present, unspecified whether perforation present    ED Discharge Orders     None          Patsey Lot, MD 02/06/24 2034  "

## 2024-02-06 NOTE — H&P (Signed)
 " History and Physical    Leon Brown FMW:[id number] DOB: Jun 12, 1965 DOA: 02/06/2024  PCP: Leonce Lucie PARAS, PA-C   Patient coming from: Home  I have personally briefly reviewed patient's old medical records in [facility]  Chief Complaint: Right lower abdominal pain  HPI: Leon Brown is a 59 y.o. male with medical history significant for dyslipidemia. Patient presented to the ED with complaints of right lower quadrant abdominal pain with associated vomiting that started yesterday.  No fevers no chills.  No other significant medical problems.  He is not on any medications at home.  ED Course: Tmax 99.3.  Heart rate 82-92.  Respiratory rate 18-22.  Blood pressure systolic 150s.  O2 sats greater 92% on room air. WBC 19.3. CTAP WC- Acute appendicitis with early adjacent phlegmonous changes. No drainable fluid collection or free air. 1 L bolus given, IV ceftriaxone  and metronidazole  started. Dr. Mavis was consulted, plan for surgery tomorrow morning at 9 AM.  Review of Systems: As per HPI all other systems reviewed and negative.  Past Medical History:  Diagnosis Date   COVID-19 11/2018   Elevated liver enzymes    Heartburn    Hyperlipidemia    Pneumothorax    Seasonal allergies     Past Surgical History:  Procedure Laterality Date   CHEST TUBE INSERTION Left 1995   COLONOSCOPY  06/29/2016   KNEE ARTHROSCOPY       reports that he quit smoking about 31 years ago. His smoking use included cigarettes. He has never used smokeless tobacco. He reports current alcohol use. He reports that he does not use drugs.  Allergies[1]  Family History  Problem Relation Age of Onset   Hypertension Brother    Colon cancer Neg Hx    Esophageal cancer Neg Hx    Rectal cancer Neg Hx    Stomach cancer Neg Hx    Colon polyps Neg Hx     Prior to Admission medications  Medication Sig Start Date End Date Taking? Authorizing Provider  aspirin EC 325 MG tablet Take 325 mg by mouth  daily.    [provider]  Multiple Vitamin (MULTIVITAMIN) tablet Take 1 tablet by mouth daily.    [provider]  omeprazole  (PRILOSEC) 20 MG capsule TAKE ONE CAPSULE BY MOUTH ONCE DAILY 07/19/22   Armbruster, Elspeth SQUIBB, MD  testosterone  cypionate (DEPOTESTOSTERONE CYPIONATE) 200 MG/ML injection Inject into the muscle every 14 (fourteen) days.    [provider]    Physical Exam: Vitals:   02/06/24 1740 02/06/24 1741 02/06/24 1915  BP:  (!) 156/102 (!) 158/102  Pulse:  92 82  Resp:  (!) 22 18  Temp:  98.3 F (36.8 C)   TempSrc:  Oral   SpO2:  100% 92%  Weight: 95.3 kg    Height: 6' 2 (1.88 m)      Constitutional: NAD, calm, comfortable Vitals:   02/06/24 1740 02/06/24 1741 02/06/24 1915  BP:  (!) 156/102 (!) 158/102  Pulse:  92 82  Resp:  (!) 22 18  Temp:  98.3 F (36.8 C)   TempSrc:  Oral   SpO2:  100% 92%  Weight: 95.3 kg    Height: 6' 2 (1.88 m)     Eyes: PERRL, lids and conjunctivae normal ENMT: Mucous membranes are moist.   Neck: normal, supple, no masses, no thyromegaly Respiratory: clear to auscultation bilaterally, no wheezing, no crackles. Normal respiratory effort. No accessory muscle use.  Cardiovascular: Regular rate and rhythm,  no murmurs / rubs / gallops. No extremity edema.  Extremities warm. Abdomen: RLQ tenderness, no masses palpated. No hepatosplenomegaly.  Musculoskeletal: no clubbing / cyanosis. No joint deformity upper and lower extremities.  Skin: no rashes, lesions, ulcers. No induration Neurologic: No facial asymmetry, management to spontaneously, speech fluent. Psychiatric: Normal judgment and insight. Alert and oriented x 3. Normal mood.   Labs on Admission: I have personally reviewed following labs and imaging studies  CBC: Recent Labs  Lab 02/06/24 1852  WBC 19.3*  HGB 18.8*  HCT 54.7*  MCV 96.3  PLT 258   Basic Metabolic Panel: Recent Labs  Lab 02/06/24 1852  NA 136  K 4.9  CL 97*  CO2 25   GLUCOSE 109*  BUN 18  CREATININE 0.98  CALCIUM 10.0   GFR: Estimated Creatinine Clearance: 95.5 mL/min (by C-G formula based on SCr of 0.98 mg/dL). Liver Function Tests: Recent Labs  Lab 02/06/24 1852  AST 27  ALT 40  ALKPHOS 66  BILITOT 1.1  PROT 7.9  ALBUMIN 4.9   Recent Labs  Lab 02/06/24 1852  LIPASE 39   Urine analysis:    Component Value Date/Time   COLORURINE YELLOW 02/06/2024 1940   APPEARANCEUR CLEAR 02/06/2024 1940   LABSPEC 1.017 02/06/2024 1940   PHURINE 7.0 02/06/2024 1940   GLUCOSEU NEGATIVE 02/06/2024 1940   HGBUR SMALL (A) 02/06/2024 1940   BILIRUBINUR NEGATIVE 02/06/2024 1940   KETONESUR 5 (A) 02/06/2024 1940   PROTEINUR NEGATIVE 02/06/2024 1940   NITRITE NEGATIVE 02/06/2024 1940   LEUKOCYTESUR NEGATIVE 02/06/2024 1940    Radiological Exams on Admission: CT ABDOMEN PELVIS W CONTRAST Result Date: 02/06/2024 EXAM: CT ABDOMEN AND PELVIS WITH CONTRAST 02/06/2024 08:08:51 PM TECHNIQUE: CT of the abdomen and pelvis was performed with the administration of 100 mL of iohexol  (OMNIPAQUE ) 300 MG/ML solution. Multiplanar reformatted images are provided for review. Automated exposure control, iterative reconstruction, and/or weight-based adjustment of the mA/kV was utilized to reduce the radiation dose to as low as reasonably achievable. COMPARISON: None available. CLINICAL HISTORY: Right lower quadrant abdominal pain. FINDINGS: LOWER CHEST: Mild subpleural atelectasis/scarring in posterior right lung base. LIVER: The liver is unremarkable. GALLBLADDER AND BILE DUCTS: Gallbladder is unremarkable. No biliary ductal dilatation. SPLEEN: No acute abnormality. PANCREAS: No acute abnormality. ADRENAL GLANDS: No acute abnormality. KIDNEYS, URETERS AND BLADDER: No stones in the kidneys or ureters. No hydronephrosis. No perinephric or periureteral stranding. Urinary bladder is unremarkable. GI AND BOWEL: Stomach demonstrates no acute abnormality. Dilated, abnormal appendix,  measuring 14 mm, with periappendiceal stranding, reflecting acute appendicitis. Early adjacent phlegmonous changes. No drainable fluid collection/abscess. There is no bowel obstruction. PERITONEUM AND RETROPERITONEUM: No ascites. No free air. VASCULATURE: Aorta is normal in caliber. Mild aortoiliac atherosclerotic calcification. LYMPH NODES: No lymphadenopathy. REPRODUCTIVE ORGANS: No acute abnormality. BONES AND SOFT TISSUES: No acute osseous abnormality. Small fat-containing umbilical hernia. No focal soft tissue abnormality. IMPRESSION: 1. Acute appendicitis with early adjacent phlegmonous changes. No drainable fluid collection or free air. 2. Aortic Atherosclerosis (ICD10-I70.0). Electronically signed by: Pinkie Pebbles MD 02/06/2024 08:16 PM EST RP Workstation: [id number]   EKG: None.  Assessment/Plan Principal Problem:   Acute appendicitis   Assessment and Plan:  Acute appendicitis-Tmax 99.3, leukocytosis of 19.3.  Blood pressure stable.  CTAP WC- Acute appendicitis with early adjacent phlegmonous changes. No drainable fluid collection or free air. - IV ceftriaxone  and metronidazole  -EDP talked to Dr. Mavis, will see in consult in a.m., plan for surgery at 9 AM, hopefully able to discharge patient  tomorrow. - Preop EKG - IV morphine  4 mg every 4 hours as needed - Zofran  as needed - 1 L bolus given, N/s 100cc/hr x 10hrs   DVT prophylaxis: SCDS Code Status: FULL code Family Communication: Spouse at bedside Disposition Plan: ~ 2 days Consults called: Gen Surg Admission status:  Obs med surg    Author: Tully FORBES Carwin, MD 02/06/2024 10:19 PM  For on call review [email or web address].      [1] No Known Allergies  "

## 2024-02-06 NOTE — Consult Note (Signed)
 Aware of consult.  Will do robotic assisted laparoscopic appendectomy in am.

## 2024-02-06 NOTE — ED Triage Notes (Signed)
 Pt to ER with c/o RLQ abdominal pain that radiates across abdomen.  Pt states pain started yesterday afternoon.  States some nausea, no vomiting.

## 2024-02-07 ENCOUNTER — Encounter (HOSPITAL_COMMUNITY): Payer: Self-pay | Admitting: Internal Medicine

## 2024-02-07 ENCOUNTER — Observation Stay (HOSPITAL_COMMUNITY): Admitting: Anesthesiology

## 2024-02-07 ENCOUNTER — Encounter (HOSPITAL_COMMUNITY): Admission: EM | Disposition: A | Payer: Self-pay | Source: Home / Self Care | Attending: Emergency Medicine

## 2024-02-07 LAB — BASIC METABOLIC PANEL WITH GFR
Anion gap: 15 (ref 5–15)
BUN: 13 mg/dL (ref 6–20)
CO2: 23 mmol/L (ref 22–32)
Calcium: 8.8 mg/dL — ABNORMAL LOW (ref 8.9–10.3)
Chloride: 101 mmol/L (ref 98–111)
Creatinine, Ser: 0.9 mg/dL (ref 0.61–1.24)
GFR, Estimated: >60 mL/min
Glucose, Bld: 124 mg/dL — ABNORMAL HIGH (ref 70–99)
Potassium: 3.9 mmol/L (ref 3.5–5.1)
Sodium: 139 mmol/L (ref 135–145)

## 2024-02-07 LAB — CBC
HCT: 49.4 % (ref 39.0–52.0)
Hemoglobin: 17.7 g/dL — ABNORMAL HIGH (ref 13.0–17.0)
MCH: 34.2 pg — ABNORMAL HIGH (ref 26.0–34.0)
MCHC: 35.8 g/dL (ref 30.0–36.0)
MCV: 95.4 fL (ref 80.0–100.0)
Platelets: 216 10*3/uL (ref 150–400)
RBC: 5.18 MIL/uL (ref 4.22–5.81)
RDW: 12.1 % (ref 11.5–15.5)
WBC: 18.9 10*3/uL — ABNORMAL HIGH (ref 4.0–10.5)
nRBC: 0 % (ref 0.0–0.2)

## 2024-02-07 LAB — HIV ANTIBODY (ROUTINE TESTING W REFLEX): HIV Screen 4th Generation wRfx: NONREACTIVE

## 2024-02-07 MED ORDER — MIDAZOLAM HCL 2 MG/2ML IJ SOLN
INTRAMUSCULAR | Status: AC
  Filled 2024-02-07: qty 2

## 2024-02-07 MED ORDER — HYDROMORPHONE HCL 1 MG/ML IJ SOLN
INTRAMUSCULAR | Status: DC | PRN
  Administered 2024-02-07: .5 mg via INTRAVENOUS

## 2024-02-07 MED ORDER — DEXMEDETOMIDINE HCL IN NACL 80 MCG/20ML IV SOLN
INTRAVENOUS | Status: DC | PRN
  Administered 2024-02-07: 20 ug via INTRAVENOUS

## 2024-02-07 MED ORDER — PHENYLEPHRINE 80 MCG/ML (10ML) SYRINGE FOR IV PUSH (FOR BLOOD PRESSURE SUPPORT)
PREFILLED_SYRINGE | INTRAVENOUS | Status: DC | PRN
  Administered 2024-02-07 (×2): 80 ug via INTRAVENOUS
  Administered 2024-02-07: 160 ug via INTRAVENOUS

## 2024-02-07 MED ORDER — SODIUM CHLORIDE 0.9 % IV SOLN
2.0000 g | Freq: Once | INTRAVENOUS | Status: AC
  Administered 2024-02-07: 2 g via INTRAVENOUS

## 2024-02-07 MED ORDER — SODIUM CHLORIDE 0.9 % IV SOLN
INTRAVENOUS | Status: AC
  Filled 2024-02-07: qty 2

## 2024-02-07 MED ORDER — ONDANSETRON HCL 4 MG/2ML IJ SOLN: 4.0000 mg | Freq: Once | INTRAMUSCULAR | Status: AC | PRN

## 2024-02-07 MED ORDER — HEMOSTATIC AGENTS (NO CHARGE) OPTIME
TOPICAL | Status: DC | PRN
  Administered 2024-02-07: 1 via TOPICAL

## 2024-02-07 MED ORDER — CHLORHEXIDINE GLUCONATE 0.12 % MT SOLN
15.0000 mL | Freq: Once | OROMUCOSAL | Status: AC
  Administered 2024-02-07: 15 mL via OROMUCOSAL

## 2024-02-07 MED ORDER — ACETAMINOPHEN 10 MG/ML IV SOLN
INTRAVENOUS | Status: AC
  Filled 2024-02-07: qty 100

## 2024-02-07 MED ORDER — CHLORHEXIDINE GLUCONATE CLOTH 2 % EX PADS: 6.0000 | MEDICATED_PAD | Freq: Once | CUTANEOUS | Status: AC

## 2024-02-07 MED ORDER — ACETAMINOPHEN 10 MG/ML IV SOLN
INTRAVENOUS | Status: DC | PRN
  Administered 2024-02-07: 1000 mg via INTRAVENOUS

## 2024-02-07 MED ORDER — DEXAMETHASONE SOD PHOSPHATE PF 10 MG/ML IJ SOLN
INTRAMUSCULAR | Status: DC | PRN
  Administered 2024-02-07: 10 mg via INTRAVENOUS

## 2024-02-07 MED ORDER — FENTANYL CITRATE (PF) 250 MCG/5ML IJ SOLN
INTRAMUSCULAR | Status: DC | PRN
  Administered 2024-02-07: 50 ug via INTRAVENOUS
  Administered 2024-02-07 (×2): 100 ug via INTRAVENOUS

## 2024-02-07 MED ORDER — BUPIVACAINE HCL (PF) 0.5 % IJ SOLN
INTRAMUSCULAR | Status: DC | PRN
  Administered 2024-02-07: 30 mL

## 2024-02-07 MED ORDER — FENTANYL CITRATE (PF) 50 MCG/ML IJ SOSY: 25.0000 ug | PREFILLED_SYRINGE | INTRAMUSCULAR | Status: AC | PRN

## 2024-02-07 MED ORDER — SUGAMMADEX SODIUM 200 MG/2ML IV SOLN
INTRAVENOUS | Status: DC | PRN
  Administered 2024-02-07: 400 mg via INTRAVENOUS

## 2024-02-07 MED ORDER — OXYCODONE HCL 5 MG PO TABS
5.0000 mg | ORAL_TABLET | Freq: Once | ORAL | Status: AC | PRN
  Administered 2024-02-07: 5 mg
  Filled 2024-02-07: qty 1

## 2024-02-07 MED ORDER — ROCURONIUM BROMIDE 10 MG/ML (PF) SYRINGE
PREFILLED_SYRINGE | INTRAVENOUS | Status: DC | PRN
  Administered 2024-02-07: 70 mg via INTRAVENOUS
  Administered 2024-02-07: 10 mg via INTRAVENOUS

## 2024-02-07 MED ORDER — OXYCODONE HCL 5 MG/5ML PO SOLN: 5.0000 mg | Freq: Once | ORAL | Status: AC | PRN

## 2024-02-07 MED ORDER — LACTATED RINGERS IV SOLN: INTRAVENOUS | Status: AC

## 2024-02-07 MED ORDER — SODIUM CHLORIDE 0.9 % IR SOLN
Status: DC | PRN
  Administered 2024-02-07: 3000 mL

## 2024-02-07 MED ORDER — STERILE WATER FOR IRRIGATION IR SOLN
Status: DC | PRN
  Administered 2024-02-07: 1000 mL

## 2024-02-07 MED ORDER — MIDAZOLAM HCL (PF) 2 MG/2ML IJ SOLN
INTRAMUSCULAR | Status: DC | PRN
  Administered 2024-02-07: 2 mg via INTRAVENOUS

## 2024-02-07 MED ORDER — HYDROMORPHONE HCL 1 MG/ML IJ SOLN
INTRAMUSCULAR | Status: AC
  Filled 2024-02-07: qty 0.5

## 2024-02-07 MED ORDER — OXYCODONE HCL 5 MG PO TABS: 5.0000 mg | ORAL_TABLET | ORAL | 0 refills | Status: AC | PRN

## 2024-02-07 MED ORDER — FENTANYL CITRATE (PF) 250 MCG/5ML IJ SOLN
INTRAMUSCULAR | Status: AC
  Filled 2024-02-07: qty 10

## 2024-02-07 MED ORDER — KETOROLAC TROMETHAMINE 30 MG/ML IJ SOLN
INTRAMUSCULAR | Status: DC | PRN
  Administered 2024-02-07: 30 mg via INTRAVENOUS

## 2024-02-07 MED ORDER — LIDOCAINE 2% (20 MG/ML) 5 ML SYRINGE
INTRAMUSCULAR | Status: DC | PRN
  Administered 2024-02-07: 100 mg via INTRAVENOUS

## 2024-02-07 MED ORDER — CHLORHEXIDINE GLUCONATE CLOTH 2 % EX PADS: 6.0000 | MEDICATED_PAD | Freq: Once | CUTANEOUS | Status: DC

## 2024-02-07 MED ORDER — BUPIVACAINE HCL (PF) 0.5 % IJ SOLN
INTRAMUSCULAR | Status: AC
  Filled 2024-02-07: qty 30

## 2024-02-07 MED ORDER — PROPOFOL 500 MG/50ML IV EMUL
INTRAVENOUS | Status: DC | PRN
  Administered 2024-02-07: 200 mg via INTRAVENOUS

## 2024-02-07 MED ORDER — ORAL CARE MOUTH RINSE: 15.0000 mL | Freq: Once | OROMUCOSAL | Status: AC

## 2024-02-07 NOTE — Discharge Instructions (Signed)

## 2024-02-07 NOTE — Anesthesia Postprocedure Evaluation (Signed)
"   Anesthesia Post Note  Patient: Leon Brown  Procedure(s) Performed: APPENDECTOMY, ROBOT-ASSISTED, LAPAROSCOPIC (Abdomen)  Patient location during evaluation: Phase II Anesthesia Type: General Level of consciousness: awake Pain management: pain level controlled Vital Signs Assessment: post-procedure vital signs reviewed and stable Respiratory status: spontaneous breathing and respiratory function stable Cardiovascular status: blood pressure returned to baseline and stable Postop Assessment: no headache and no apparent nausea or vomiting Anesthetic complications: no Comments: Late entry   No notable events documented.   Last Vitals:  Vitals:   02/07/24 1330 02/07/24 1345  BP: 97/69 104/75  Pulse: 69 77  Resp: 15 (!) 21  Temp: 36.7 C 36.9 C  SpO2: 94% 96%    Last Pain:  Vitals:   02/07/24 1420  TempSrc:   PainSc: 5                  Yvonna PARAS Shloima Clinch      "

## 2024-02-07 NOTE — Anesthesia Procedure Notes (Signed)
 Procedure Name: Intubation Date/Time: 02/07/2024 11:34 AM  Performed by: Cordella Elvie HERO, CRNAPre-anesthesia Checklist: Patient identified, Emergency Drugs available, Suction available, Patient being monitored and Timeout performed Patient Re-evaluated:Patient Re-evaluated prior to induction Oxygen Delivery Method: Circle system utilized Preoxygenation: Pre-oxygenation with 100% oxygen Induction Type: IV induction Ventilation: Mask ventilation without difficulty Laryngoscope Size: Mac and 4 Grade View: Grade I Tube type: Oral Tube size: 7.5 mm Number of attempts: 1 Airway Equipment and Method: Stylet Placement Confirmation: ETT inserted through vocal cords under direct vision, positive ETCO2, CO2 detector and breath sounds checked- equal and bilateral Secured at: 23 cm Tube secured with: Tape Dental Injury: Teeth and Oropharynx as per pre-operative assessment

## 2024-02-07 NOTE — Interval H&P Note (Signed)
 History and Physical Interval Note:  02/07/2024 10:46 AM  Leon Brown  has presented today for surgery, with the diagnosis of acute appendicitis.  The various methods of treatment have been discussed with the patient and family. After consideration of risks, benefits and other options for treatment, the patient has consented to  Procedures: APPENDECTOMY, ROBOT-ASSISTED, LAPAROSCOPIC (N/A) as a surgical intervention.  The patient's history has been reviewed, patient examined, no change in status, stable for surgery.  I have reviewed the patient's chart and labs.  Questions were answered to the patient's satisfaction.     [name]

## 2024-02-07 NOTE — Op Note (Signed)
 Patient:  Leon Brown  DOB:  Dec 25, 1965  MRN:  [id number]   Preop Diagnosis: Acute appendicitis  Postop Diagnosis: Same  Procedure: Robotic assisted laparoscopic appendectomy  Surgeon: [name], MD  Anes: General Endotracheal  Indications: Patient is a 59 year old white male who presented to [facility] with right lower quadrant abdominal pain.  CT scan of the abdomen revealed acute appendicitis with an associated phlegmon.  The risks and benefits of the procedure including bleeding, infection, and the possibility of an open procedure were fully explained to the patient, who gave informed consent.  Procedure note: The patient was placed in the supine position.  After induction of general endotracheal anesthesia, the abdomen was prepped and draped using the usual sterile technique with ChloraPrep.  Surgical site confirmation was performed.  An incision was made in the left upper quadrant at Palmer's point.  A Veress needle was introduced into the abdominal cavity and confirmation of placement was done using the saline drop test.  The abdomen was then insufflated to 15 mmHg pressure.  An 8 mm trocar was introduced into the abdominal cavity under direct visualization without difficulty.  Additional 8 mm trocars were placed in the left flank and left lower quadrant regions.  The robot was then docked and targeted.  The appendix was noted to be acutely inflamed with evidence of some spillage of necrotic tissue while the appendix was being manipulated.  The mesoappendix was divided using the vessel sealer.  A white load Endo GIA sure fire was placed across the base of the appendix and fired.  The appendix was then removed using an Endo Catch bag.  The right lower quadrant was copiously irrigated with normal saline.  Surgicel powder was placed in the right lower quadrant.  The staple line was inspected noted within normal limits.  The robot was undocked and all air was evacuated from the  abdominal cavity prior to the removal of the trocars.  All wounds were irrigated with normal saline.  All wounds were injected with 0.5% Sensorcaine .  The left flank incision fascia was reapproximated using an 0 Vicryl interrupted suture.  All incisions were closed using a 4-0 Monocryl subcuticular suture.  Dermabond was applied.  All tape and needle counts were correct at the end of the procedure.  The patient was extubated in the operating room and transferred to PACU in stable condition.  Complications: None  EBL: Minimal  Specimen: Appendix

## 2024-02-07 NOTE — ED Notes (Signed)
 Pre-op here for pt.

## 2024-02-07 NOTE — H&P (View-Only) (Signed)
 Reason for Consult: Right lower quadrant abdominal pain Referring Physician: Dr. Juvenal Lyndy CROME Leon Brown is an 58 y.o. male.  HPI: Patient is a 59 year old white male who presented to the emergency room yesterday evening with worsening right lower quadrant abdominal pain.  This had started earlier in the day.  A CT scan of the abdomen and pelvis revealed acute appendicitis with a small phlegmon next to it.  No free air or evidence of perforation is noted.  He was started on IV Rocephin  and Flagyl .  Past Medical History:  Diagnosis Date   COVID-19 11/2018   Elevated liver enzymes    Heartburn    Hyperlipidemia    Pneumothorax    Seasonal allergies     Past Surgical History:  Procedure Laterality Date   CHEST TUBE INSERTION Left 1995   COLONOSCOPY  06/29/2016   KNEE ARTHROSCOPY      Family History  Problem Relation Age of Onset   Hypertension Brother    Colon cancer Neg Hx    Esophageal cancer Neg Hx    Rectal cancer Neg Hx    Stomach cancer Neg Hx    Colon polyps Neg Hx     Social History:  reports that he quit smoking about 31 years ago. His smoking use included cigarettes. He has never used smokeless tobacco. He reports current alcohol use. He reports that he does not use drugs.  Allergies: Allergies[1]  Medications: I have reviewed the patient's current medications. Prior to Admission: (Not in a hospital admission)   Results for orders placed or performed during the hospital encounter of 02/06/24 (from the past 48 hours)  Lipase, blood     Status: None   Collection Time: 02/06/24  6:52 PM  Result Value Ref Range   Lipase 39 11 - 51 U/L    Comment: Performed at [facility], [address] [place], [place] [address]  Comprehensive metabolic panel     Status: Abnormal   Collection Time: 02/06/24  6:52 PM  Result Value Ref Range   Sodium 136 135 - 145 mmol/L   Potassium 4.9 3.5 - 5.1 mmol/L   Chloride 97 (L) 98 - 111 mmol/L   CO2 25 22 - 32 mmol/L   Glucose, Bld 109  (H) 70 - 99 mg/dL    Comment: Glucose reference range applies only to samples taken after fasting for at least 8 hours.   BUN 18 6 - 20 mg/dL   Creatinine, Ser 9.01 0.61 - 1.24 mg/dL   Calcium 89.9 8.9 - 89.6 mg/dL   Total Protein 7.9 6.5 - 8.1 g/dL   Albumin 4.9 3.5 - 5.0 g/dL   AST 27 15 - 41 U/L   ALT 40 0 - 44 U/L   Alkaline Phosphatase 66 38 - 126 U/L   Total Bilirubin 1.1 0.0 - 1.2 mg/dL   GFR, Estimated >39 >39 mL/min    Comment: (NOTE) Calculated using the CKD-EPI Creatinine Equation (2021)    Anion gap 15 5 - 15    Comment: Performed at [facility], [address] [place], [place] [address]  CBC     Status: Abnormal   Collection Time: 02/06/24  6:52 PM  Result Value Ref Range   WBC 19.3 (H) 4.0 - 10.5 K/uL   RBC 5.68 4.22 - 5.81 MIL/uL   Hemoglobin 18.8 (H) 13.0 - 17.0 g/dL   HCT 45.2 (H) 60.9 - 47.9 %   MCV 96.3 80.0 - 100.0 fL   MCH 33.1 26.0 -  34.0 pg   MCHC 34.4 30.0 - 36.0 g/dL   RDW 88.0 88.4 - 84.4 %   Platelets 258 150 - 400 K/uL   nRBC 0.0 0.0 - 0.2 %    Comment: Performed at [facility], [address] [place], [place] [address]  Urinalysis, Routine w reflex microscopic -Urine, Clean Catch     Status: Abnormal   Collection Time: 02/06/24  7:40 PM  Result Value Ref Range   Color, Urine YELLOW YELLOW   APPearance CLEAR CLEAR   Specific Gravity, Urine 1.017 1.005 - 1.030   pH 7.0 5.0 - 8.0   Glucose, UA NEGATIVE NEGATIVE mg/dL   Hgb urine dipstick SMALL (A) NEGATIVE   Bilirubin Urine NEGATIVE NEGATIVE   Ketones, ur 5 (A) NEGATIVE mg/dL   Protein, ur NEGATIVE NEGATIVE mg/dL   Nitrite NEGATIVE NEGATIVE   Leukocytes,Ua NEGATIVE NEGATIVE   RBC / HPF 0-5 0 - 5 RBC/hpf   WBC, UA 0-5 0 - 5 WBC/hpf   Bacteria, UA NONE SEEN NONE SEEN   Squamous Epithelial / HPF 0-5 0 - 5 /HPF    Comment: Performed at [facility], [address], [place], [place] [address]  Basic metabolic panel     Status: Abnormal   Collection Time: 02/07/24  5:12 AM  Result Value Ref  Range   Sodium 139 135 - 145 mmol/L   Potassium 3.9 3.5 - 5.1 mmol/L   Chloride 101 98 - 111 mmol/L   CO2 23 22 - 32 mmol/L   Glucose, Bld 124 (H) 70 - 99 mg/dL    Comment: Glucose reference range applies only to samples taken after fasting for at least 8 hours.   BUN 13 6 - 20 mg/dL   Creatinine, Ser 9.09 0.61 - 1.24 mg/dL   Calcium 8.8 (L) 8.9 - 10.3 mg/dL   GFR, Estimated >39 >39 mL/min    Comment: (NOTE) Calculated using the CKD-EPI Creatinine Equation (2021)    Anion gap 15 5 - 15    Comment: Performed at [facility], [address], [place], [place] [address]  CBC     Status: Abnormal   Collection Time: 02/07/24  5:12 AM  Result Value Ref Range   WBC 18.9 (H) 4.0 - 10.5 K/uL   RBC 5.18 4.22 - 5.81 MIL/uL   Hemoglobin 17.7 (H) 13.0 - 17.0 g/dL   HCT 50.5 60.9 - 47.9 %   MCV 95.4 80.0 - 100.0 fL   MCH 34.2 (H) 26.0 - 34.0 pg   MCHC 35.8 30.0 - 36.0 g/dL   RDW 87.8 88.4 - 84.4 %   Platelets 216 150 - 400 K/uL   nRBC 0.0 0.0 - 0.2 %    Comment: Performed at [facility], [address], [place], [place] [address]    CT ABDOMEN PELVIS W CONTRAST Result Date: 02/06/2024 EXAM: CT ABDOMEN AND PELVIS WITH CONTRAST 02/06/2024 08:08:51 PM TECHNIQUE: CT of the abdomen and pelvis was performed with the administration of 100 mL of iohexol  (OMNIPAQUE ) 300 MG/ML solution. Multiplanar reformatted images are provided for review. Automated exposure control, iterative reconstruction, and/or weight-based adjustment of the mA/kV was utilized to reduce the radiation dose to as low as reasonably achievable. COMPARISON: None available. CLINICAL HISTORY: Right lower quadrant abdominal pain. FINDINGS: LOWER CHEST: Mild subpleural atelectasis/scarring in posterior right lung base. LIVER: The liver is unremarkable. GALLBLADDER AND BILE DUCTS: Gallbladder is unremarkable. No biliary ductal dilatation. SPLEEN: No acute abnormality. PANCREAS: No acute abnormality. ADRENAL GLANDS: No acute abnormality. KIDNEYS,  URETERS AND BLADDER: No stones in the kidneys or ureters. No hydronephrosis. No perinephric or periureteral stranding. Urinary bladder is unremarkable. GI AND BOWEL: Stomach demonstrates no acute abnormality. Dilated, abnormal appendix, measuring 14 mm, with periappendiceal stranding, reflecting acute appendicitis. Early adjacent phlegmonous changes. No drainable fluid collection/abscess. There is no bowel obstruction. PERITONEUM AND RETROPERITONEUM: No ascites. No free air. VASCULATURE: Aorta is normal in caliber. Mild aortoiliac atherosclerotic calcification. LYMPH NODES: No lymphadenopathy. REPRODUCTIVE ORGANS: No acute abnormality. BONES AND SOFT TISSUES: No acute osseous abnormality. Small fat-containing umbilical hernia. No focal soft tissue abnormality. IMPRESSION: 1. Acute appendicitis with early adjacent phlegmonous changes. No drainable fluid collection or free air. 2. Aortic Atherosclerosis (ICD10-I70.0). Electronically signed by: Pinkie Pebbles MD 02/06/2024 08:16 PM EST RP Workstation: [id number]    ROS:  Pertinent items are noted in HPI.  Blood pressure 139/84, pulse 86, temperature 99.4 F (37.4 C), temperature source Oral, resp. rate 15, height 6' 2 (1.88 m), weight 95.3 kg, SpO2 91%. Physical Exam: Well-developed well-nourished white male in no acute distress Head is normocephalic, atraumatic Lungs clear to auscultation with equal breath sounds bilaterally Heart examination reveals regular rate and rhythm without S3, S4, murmurs Abdomen is soft with tenderness noted in the right lower quadrant to palpation.  No rigidity is noted.  CT scan images personally reviewed  Assessment/Plan: Impression: Acute appendicitis Plan: Patient will be taken to the operating room this morning for robotic assisted laparoscopic appendectomy.  The risks and benefits of the procedure including bleeding, infection, and the possibility of an open procedure were fully explained to the patient, who gave  informed consent.  [name] 02/07/2024, 7:15 AM         [1] No Known Allergies

## 2024-02-07 NOTE — Anesthesia Preprocedure Evaluation (Signed)
"                                    Anesthesia Evaluation  Patient identified by MRN, date of birth, ID band Patient awake    Reviewed: Allergy & Precautions, H&P , NPO status , Patient's Chart, lab work & pertinent test results, reviewed documented beta blocker date and time   History of Anesthesia Complications (+) history of anesthetic complications  Airway Mallampati: II  TM Distance: >3 FB Neck ROM: full    Dental no notable dental hx.    Pulmonary neg pulmonary ROS, former smoker   Pulmonary exam normal breath sounds clear to auscultation       Cardiovascular Exercise Tolerance: Good hypertension, negative cardio ROS  Rhythm:regular Rate:Normal     Neuro/Psych negative neurological ROS  negative psych ROS   GI/Hepatic negative GI ROS, Neg liver ROS,,,  Endo/Other  negative endocrine ROS    Renal/GU negative Renal ROS  negative genitourinary   Musculoskeletal   Abdominal   Peds  Hematology negative hematology ROS (+)   Anesthesia Other Findings   Reproductive/Obstetrics negative OB ROS                              Anesthesia Physical Anesthesia Plan  ASA: 2  Anesthesia Plan: General and General ETT   Post-op Pain Management:    Induction:   PONV Risk Score and Plan: Ondansetron   Airway Management Planned:   Additional Equipment:   Intra-op Plan:   Post-operative Plan:   Informed Consent: I have reviewed the patients History and Physical, chart, labs and discussed the procedure including the risks, benefits and alternatives for the proposed anesthesia with the patient or authorized representative who has indicated his/her understanding and acceptance.     Dental Advisory Given  Plan Discussed with: CRNA  Anesthesia Plan Comments:         Anesthesia Quick Evaluation  "

## 2024-02-07 NOTE — ED Notes (Signed)
 Pt verbalized last ate or drank last night at 11pm, and wife has all jewelry. Pt is changing and wife will take clothes. Consent ready for signature at bedside. Surgery received report.

## 2024-02-07 NOTE — Consult Note (Signed)
 Reason for Consult: Right lower quadrant abdominal pain Referring Physician: Dr. Juvenal Lyndy CROME Leon Brown is an 58 y.o. male.  HPI: Patient is a 59 year old white male who presented to the emergency room yesterday evening with worsening right lower quadrant abdominal pain.  This had started earlier in the day.  A CT scan of the abdomen and pelvis revealed acute appendicitis with a small phlegmon next to it.  No free air or evidence of perforation is noted.  He was started on IV Rocephin  and Flagyl .  Past Medical History:  Diagnosis Date   COVID-19 11/2018   Elevated liver enzymes    Heartburn    Hyperlipidemia    Pneumothorax    Seasonal allergies     Past Surgical History:  Procedure Laterality Date   CHEST TUBE INSERTION Left 1995   COLONOSCOPY  06/29/2016   KNEE ARTHROSCOPY      Family History  Problem Relation Age of Onset   Hypertension Brother    Colon cancer Neg Hx    Esophageal cancer Neg Hx    Rectal cancer Neg Hx    Stomach cancer Neg Hx    Colon polyps Neg Hx     Social History:  reports that he quit smoking about 31 years ago. His smoking use included cigarettes. He has never used smokeless tobacco. He reports current alcohol use. He reports that he does not use drugs.  Allergies: Allergies[1]  Medications: I have reviewed the patient's current medications. Prior to Admission: (Not in a hospital admission)   Results for orders placed or performed during the hospital encounter of 02/06/24 (from the past 48 hours)  Lipase, blood     Status: None   Collection Time: 02/06/24  6:52 PM  Result Value Ref Range   Lipase 39 11 - 51 U/L    Comment: Performed at [facility], [address] [place], [place] [address]  Comprehensive metabolic panel     Status: Abnormal   Collection Time: 02/06/24  6:52 PM  Result Value Ref Range   Sodium 136 135 - 145 mmol/L   Potassium 4.9 3.5 - 5.1 mmol/L   Chloride 97 (L) 98 - 111 mmol/L   CO2 25 22 - 32 mmol/L   Glucose, Bld 109  (H) 70 - 99 mg/dL    Comment: Glucose reference range applies only to samples taken after fasting for at least 8 hours.   BUN 18 6 - 20 mg/dL   Creatinine, Ser 9.01 0.61 - 1.24 mg/dL   Calcium 89.9 8.9 - 89.6 mg/dL   Total Protein 7.9 6.5 - 8.1 g/dL   Albumin 4.9 3.5 - 5.0 g/dL   AST 27 15 - 41 U/L   ALT 40 0 - 44 U/L   Alkaline Phosphatase 66 38 - 126 U/L   Total Bilirubin 1.1 0.0 - 1.2 mg/dL   GFR, Estimated >39 >39 mL/min    Comment: (NOTE) Calculated using the CKD-EPI Creatinine Equation (2021)    Anion gap 15 5 - 15    Comment: Performed at [facility], [address] [place], [place] [address]  CBC     Status: Abnormal   Collection Time: 02/06/24  6:52 PM  Result Value Ref Range   WBC 19.3 (H) 4.0 - 10.5 K/uL   RBC 5.68 4.22 - 5.81 MIL/uL   Hemoglobin 18.8 (H) 13.0 - 17.0 g/dL   HCT 45.2 (H) 60.9 - 47.9 %   MCV 96.3 80.0 - 100.0 fL   MCH 33.1 26.0 -  34.0 pg   MCHC 34.4 30.0 - 36.0 g/dL   RDW 88.0 88.4 - 84.4 %   Platelets 258 150 - 400 K/uL   nRBC 0.0 0.0 - 0.2 %    Comment: Performed at [facility], [address] [place], [place] [address]  Urinalysis, Routine w reflex microscopic -Urine, Clean Catch     Status: Abnormal   Collection Time: 02/06/24  7:40 PM  Result Value Ref Range   Color, Urine YELLOW YELLOW   APPearance CLEAR CLEAR   Specific Gravity, Urine 1.017 1.005 - 1.030   pH 7.0 5.0 - 8.0   Glucose, UA NEGATIVE NEGATIVE mg/dL   Hgb urine dipstick SMALL (A) NEGATIVE   Bilirubin Urine NEGATIVE NEGATIVE   Ketones, ur 5 (A) NEGATIVE mg/dL   Protein, ur NEGATIVE NEGATIVE mg/dL   Nitrite NEGATIVE NEGATIVE   Leukocytes,Ua NEGATIVE NEGATIVE   RBC / HPF 0-5 0 - 5 RBC/hpf   WBC, UA 0-5 0 - 5 WBC/hpf   Bacteria, UA NONE SEEN NONE SEEN   Squamous Epithelial / HPF 0-5 0 - 5 /HPF    Comment: Performed at [facility], [address], [place], [place] [address]  Basic metabolic panel     Status: Abnormal   Collection Time: 02/07/24  5:12 AM  Result Value Ref  Range   Sodium 139 135 - 145 mmol/L   Potassium 3.9 3.5 - 5.1 mmol/L   Chloride 101 98 - 111 mmol/L   CO2 23 22 - 32 mmol/L   Glucose, Bld 124 (H) 70 - 99 mg/dL    Comment: Glucose reference range applies only to samples taken after fasting for at least 8 hours.   BUN 13 6 - 20 mg/dL   Creatinine, Ser 9.09 0.61 - 1.24 mg/dL   Calcium 8.8 (L) 8.9 - 10.3 mg/dL   GFR, Estimated >39 >39 mL/min    Comment: (NOTE) Calculated using the CKD-EPI Creatinine Equation (2021)    Anion gap 15 5 - 15    Comment: Performed at [facility], [address], [place], [place] [address]  CBC     Status: Abnormal   Collection Time: 02/07/24  5:12 AM  Result Value Ref Range   WBC 18.9 (H) 4.0 - 10.5 K/uL   RBC 5.18 4.22 - 5.81 MIL/uL   Hemoglobin 17.7 (H) 13.0 - 17.0 g/dL   HCT 50.5 60.9 - 47.9 %   MCV 95.4 80.0 - 100.0 fL   MCH 34.2 (H) 26.0 - 34.0 pg   MCHC 35.8 30.0 - 36.0 g/dL   RDW 87.8 88.4 - 84.4 %   Platelets 216 150 - 400 K/uL   nRBC 0.0 0.0 - 0.2 %    Comment: Performed at [facility], [address], [place], [place] [address]    CT ABDOMEN PELVIS W CONTRAST Result Date: 02/06/2024 EXAM: CT ABDOMEN AND PELVIS WITH CONTRAST 02/06/2024 08:08:51 PM TECHNIQUE: CT of the abdomen and pelvis was performed with the administration of 100 mL of iohexol  (OMNIPAQUE ) 300 MG/ML solution. Multiplanar reformatted images are provided for review. Automated exposure control, iterative reconstruction, and/or weight-based adjustment of the mA/kV was utilized to reduce the radiation dose to as low as reasonably achievable. COMPARISON: None available. CLINICAL HISTORY: Right lower quadrant abdominal pain. FINDINGS: LOWER CHEST: Mild subpleural atelectasis/scarring in posterior right lung base. LIVER: The liver is unremarkable. GALLBLADDER AND BILE DUCTS: Gallbladder is unremarkable. No biliary ductal dilatation. SPLEEN: No acute abnormality. PANCREAS: No acute abnormality. ADRENAL GLANDS: No acute abnormality. KIDNEYS,  URETERS AND BLADDER: No stones in the kidneys or ureters. No hydronephrosis. No perinephric or periureteral stranding. Urinary bladder is unremarkable. GI AND BOWEL: Stomach demonstrates no acute abnormality. Dilated, abnormal appendix, measuring 14 mm, with periappendiceal stranding, reflecting acute appendicitis. Early adjacent phlegmonous changes. No drainable fluid collection/abscess. There is no bowel obstruction. PERITONEUM AND RETROPERITONEUM: No ascites. No free air. VASCULATURE: Aorta is normal in caliber. Mild aortoiliac atherosclerotic calcification. LYMPH NODES: No lymphadenopathy. REPRODUCTIVE ORGANS: No acute abnormality. BONES AND SOFT TISSUES: No acute osseous abnormality. Small fat-containing umbilical hernia. No focal soft tissue abnormality. IMPRESSION: 1. Acute appendicitis with early adjacent phlegmonous changes. No drainable fluid collection or free air. 2. Aortic Atherosclerosis (ICD10-I70.0). Electronically signed by: Pinkie Pebbles MD 02/06/2024 08:16 PM EST RP Workstation: [id number]    ROS:  Pertinent items are noted in HPI.  Blood pressure 139/84, pulse 86, temperature 99.4 F (37.4 C), temperature source Oral, resp. rate 15, height 6' 2 (1.88 m), weight 95.3 kg, SpO2 91%. Physical Exam: Well-developed well-nourished white male in no acute distress Head is normocephalic, atraumatic Lungs clear to auscultation with equal breath sounds bilaterally Heart examination reveals regular rate and rhythm without S3, S4, murmurs Abdomen is soft with tenderness noted in the right lower quadrant to palpation.  No rigidity is noted.  CT scan images personally reviewed  Assessment/Plan: Impression: Acute appendicitis Plan: Patient will be taken to the operating room this morning for robotic assisted laparoscopic appendectomy.  The risks and benefits of the procedure including bleeding, infection, and the possibility of an open procedure were fully explained to the patient, who gave  informed consent.  Leon Brown 02/07/2024, 7:15 AM         [1] No Known Allergies

## 2024-02-07 NOTE — Progress Notes (Signed)
 Inpatient Care Management (ICM) has reviewed patient and no other ICM needs have been identified at this time. We will continue to monitor patient advancement through interdisciplinary progression rounds. If new patient transition needs arise, please place a ICM consult.   02/07/24 0805  TOC Brief Assessment  Insurance and Status Reviewed  Patient has primary care physician Yes  Home environment has been reviewed From Home  Prior level of function: Independent  Prior/Current Home Services No current home services  Social Drivers of Health Review SDOH reviewed interventions complete  Readmission risk has been reviewed Yes  Transition of care needs no transition of care needs at this time

## 2024-02-07 NOTE — Transfer of Care (Signed)
 Immediate Anesthesia Transfer of Care Note  Patient: Leon Brown  Procedure(s) Performed: APPENDECTOMY, ROBOT-ASSISTED, LAPAROSCOPIC (Abdomen)  Patient Location: PACU  Anesthesia Type:General  Level of Consciousness: awake, alert , oriented, and patient cooperative  Airway & Oxygen Therapy: Patient Spontanous Breathing and Patient connected to face mask oxygen  Post-op Assessment: Report given to RN, Post -op Vital signs reviewed and stable, and Patient moving all extremities X 4  Post vital signs: Reviewed and stable  Last Vitals:  Vitals Value Taken Time  BP 101/78 02/07/24 12:45  Temp 39.5 C 02/07/24 12:33  Pulse 96 02/07/24 12:46  Resp 21 02/07/24 12:46  SpO2 98 % 02/07/24 12:46  Vitals shown include unfiled device data.  Last Pain:  Vitals:   02/07/24 1233  TempSrc:   PainSc: 0-No pain      Patients Stated Pain Goal: 4 (02/07/24 1233)  Complications: No notable events documented.

## 2024-02-13 ENCOUNTER — Ambulatory Visit: Admitting: Internal Medicine
